# Patient Record
Sex: Male | Born: 2000
Health system: Southern US, Community
[De-identification: ages and names within clinical notes are randomized; demographics above are authoritative.]

## PROBLEM LIST (undated history)

## (undated) ENCOUNTER — Emergency Department (HOSPITAL_BASED_OUTPATIENT_CLINIC_OR_DEPARTMENT_OTHER): Admission: EM | Payer: 59 | Source: Home / Self Care

## (undated) DIAGNOSIS — H53009 Unspecified amblyopia, unspecified eye: Secondary | ICD-10-CM

## (undated) DIAGNOSIS — J45909 Unspecified asthma, uncomplicated: Secondary | ICD-10-CM

---
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
## 2020-04-09 ENCOUNTER — Inpatient Hospital Stay (HOSPITAL_COMMUNITY)
Admission: EM | Admit: 2020-04-09 | Discharge: 2020-04-18 | DRG: 558 | Disposition: A | Discharge status: Home / Self Care | Payer: No Typology Code available for payment source | Attending: Internal Medicine | Admitting: Internal Medicine

## 2020-04-09 ENCOUNTER — Other Ambulatory Visit: Payer: Self-pay

## 2020-04-09 ENCOUNTER — Encounter (HOSPITAL_COMMUNITY): Payer: Self-pay | Admitting: Emergency Medicine

## 2020-04-09 DIAGNOSIS — Z20822 Contact with and (suspected) exposure to covid-19: Secondary | ICD-10-CM | POA: Diagnosis present

## 2020-04-09 DIAGNOSIS — J45990 Exercise induced bronchospasm: Secondary | ICD-10-CM | POA: Diagnosis present

## 2020-04-09 DIAGNOSIS — J3089 Other allergic rhinitis: Secondary | ICD-10-CM

## 2020-04-09 DIAGNOSIS — M6282 Rhabdomyolysis: Principal | ICD-10-CM | POA: Diagnosis present

## 2020-04-09 DIAGNOSIS — Z8249 Family history of ischemic heart disease and other diseases of the circulatory system: Secondary | ICD-10-CM

## 2020-04-09 DIAGNOSIS — Z91018 Allergy to other foods: Secondary | ICD-10-CM

## 2020-04-09 DIAGNOSIS — Z03818 Encounter for observation for suspected exposure to other biological agents ruled out: Secondary | ICD-10-CM | POA: Diagnosis not present

## 2020-04-09 DIAGNOSIS — I7300 Raynaud's syndrome without gangrene: Secondary | ICD-10-CM | POA: Diagnosis present

## 2020-04-09 HISTORY — DX: Unspecified amblyopia, unspecified eye: H53.009

## 2020-04-09 NOTE — ED Triage Notes (Signed)
Patient reports bilateral upper arm muscle pain after lifting heavy weights yesterday during summer camp.

## 2020-04-10 ENCOUNTER — Encounter (HOSPITAL_COMMUNITY): Payer: Self-pay | Admitting: Emergency Medicine

## 2020-04-10 DIAGNOSIS — M6282 Rhabdomyolysis: Secondary | ICD-10-CM | POA: Diagnosis not present

## 2020-04-10 DIAGNOSIS — Z91018 Allergy to other foods: Secondary | ICD-10-CM | POA: Diagnosis not present

## 2020-04-10 DIAGNOSIS — J45990 Exercise induced bronchospasm: Secondary | ICD-10-CM

## 2020-04-10 DIAGNOSIS — I7300 Raynaud's syndrome without gangrene: Secondary | ICD-10-CM

## 2020-04-10 DIAGNOSIS — R748 Abnormal levels of other serum enzymes: Secondary | ICD-10-CM | POA: Diagnosis not present

## 2020-04-10 DIAGNOSIS — Z20822 Contact with and (suspected) exposure to covid-19: Secondary | ICD-10-CM | POA: Diagnosis present

## 2020-04-10 DIAGNOSIS — R7401 Elevation of levels of liver transaminase levels: Secondary | ICD-10-CM | POA: Diagnosis not present

## 2020-04-10 DIAGNOSIS — Z8249 Family history of ischemic heart disease and other diseases of the circulatory system: Secondary | ICD-10-CM | POA: Diagnosis not present

## 2020-04-10 DIAGNOSIS — J3089 Other allergic rhinitis: Secondary | ICD-10-CM

## 2020-04-10 HISTORY — DX: Other allergic rhinitis: J30.89

## 2020-04-10 HISTORY — DX: Exercise induced bronchospasm: J45.990

## 2020-04-10 HISTORY — DX: Raynaud's syndrome without gangrene: I73.00

## 2020-04-10 LAB — I-STAT CHEM 8, ED
BUN: 17 mg/dL (ref 6–20)
Calcium, Ion: 1.21 mmol/L (ref 1.15–1.40)
Chloride: 100 mmol/L (ref 98–111)
Creatinine, Ser: 1.2 mg/dL (ref 0.61–1.24)
Glucose, Bld: 81 mg/dL (ref 70–99)
HCT: 44 % (ref 39.0–52.0)
Hemoglobin: 15 g/dL (ref 13.0–17.0)
Potassium: 3.3 mmol/L — ABNORMAL LOW (ref 3.5–5.1)
Sodium: 140 mmol/L (ref 135–145)
TCO2: 30 mmol/L (ref 22–32)

## 2020-04-10 LAB — RENAL FUNCTION PANEL
Albumin: 3.6 g/dL (ref 3.5–5.0)
Albumin: 3.2 g/dL — ABNORMAL LOW (ref 3.5–5.0)
Albumin: 3.5 g/dL (ref 3.5–5.0)
Anion gap: 11 (ref 5–15)
Anion gap: 10 (ref 5–15)
Anion gap: 7 (ref 5–15)
BUN: 16 mg/dL (ref 6–20)
BUN: 16 mg/dL (ref 6–20)
BUN: 13 mg/dL (ref 6–20)
CO2: 23 mmol/L (ref 22–32)
CO2: 23 mmol/L (ref 22–32)
CO2: 23 mmol/L (ref 22–32)
Calcium: 8.7 mg/dL — ABNORMAL LOW (ref 8.9–10.3)
Calcium: 8.6 mg/dL — ABNORMAL LOW (ref 8.9–10.3)
Calcium: 8.7 mg/dL — ABNORMAL LOW (ref 8.9–10.3)
Chloride: 104 mmol/L (ref 98–111)
Chloride: 105 mmol/L (ref 98–111)
Chloride: 108 mmol/L (ref 98–111)
Creatinine, Ser: 1 mg/dL (ref 0.61–1.24)
Creatinine, Ser: 1.06 mg/dL (ref 0.61–1.24)
Creatinine, Ser: 1.03 mg/dL (ref 0.61–1.24)
GFR calc Af Amer: >60 mL/min (ref >60)
GFR calc Af Amer: >60 mL/min (ref >60)
GFR calc Af Amer: >60 mL/min (ref >60)
GFR calc non Af Amer: >60 mL/min (ref >60)
GFR calc non Af Amer: >60 mL/min (ref >60)
GFR calc non Af Amer: >60 mL/min (ref >60)
Glucose, Bld: 129 mg/dL — ABNORMAL HIGH (ref 70–99)
Glucose, Bld: 88 mg/dL (ref 70–99)
Glucose, Bld: 85 mg/dL (ref 70–99)
Phosphorus: 3.6 mg/dL (ref 2.5–4.6)
Phosphorus: 3.9 mg/dL (ref 2.5–4.6)
Phosphorus: 3.6 mg/dL (ref 2.5–4.6)
Potassium: 3.4 mmol/L — ABNORMAL LOW (ref 3.5–5.1)
Potassium: 4.5 mmol/L (ref 3.5–5.1)
Potassium: 4.3 mmol/L (ref 3.5–5.1)
Sodium: 138 mmol/L (ref 135–145)
Sodium: 138 mmol/L (ref 135–145)
Sodium: 138 mmol/L (ref 135–145)

## 2020-04-10 LAB — HIV ANTIBODY (ROUTINE TESTING W REFLEX): HIV Screen 4th Generation wRfx: NONREACTIVE

## 2020-04-10 LAB — CBC WITH DIFFERENTIAL/PLATELET
Abs Immature Granulocytes: 0.02 10*3/uL (ref 0.00–0.07)
Basophils Absolute: 0 10*3/uL (ref 0.0–0.1)
Basophils Relative: 0 %
Eosinophils Absolute: 0.2 10*3/uL (ref 0.0–0.5)
Eosinophils Relative: 2 %
HCT: 40.6 % (ref 39.0–52.0)
Hemoglobin: 12.8 g/dL — ABNORMAL LOW (ref 13.0–17.0)
Immature Granulocytes: 0 %
Lymphocytes Relative: 30 %
Lymphs Abs: 2.1 10*3/uL (ref 0.7–4.0)
MCH: 26.8 pg (ref 26.0–34.0)
MCHC: 31.5 g/dL (ref 30.0–36.0)
MCV: 84.9 fL (ref 80.0–100.0)
Monocytes Absolute: 0.5 10*3/uL (ref 0.1–1.0)
Monocytes Relative: 8 %
Neutro Abs: 4.1 10*3/uL (ref 1.7–7.7)
Neutrophils Relative %: 60 %
Platelets: 197 10*3/uL (ref 150–400)
RBC: 4.78 MIL/uL (ref 4.22–5.81)
RDW: 12.3 % (ref 11.5–15.5)
WBC: 6.9 10*3/uL (ref 4.0–10.5)
nRBC: 0 % (ref 0.0–0.2)

## 2020-04-10 LAB — SARS CORONAVIRUS 2 BY RT PCR (HOSPITAL ORDER, PERFORMED IN ~~LOC~~ HOSPITAL LAB): SARS Coronavirus 2: NEGATIVE

## 2020-04-10 LAB — CK
Total CK: 45589 U/L — ABNORMAL HIGH (ref 49–397)
Total CK: 45544 U/L — ABNORMAL HIGH (ref 49–397)
Total CK: >50000 U/L — ABNORMAL HIGH (ref 49–397)

## 2020-04-10 MED ORDER — NAPROXEN 250 MG PO TABS
500.0000 mg | ORAL_TABLET | Freq: Once | ORAL | Status: AC
  Administered 2020-04-10: 500 mg via ORAL
  Filled 2020-04-10: qty 2

## 2020-04-10 MED ORDER — SODIUM CHLORIDE 0.9 % IV BOLUS
1000.0000 mL | Freq: Once | INTRAVENOUS | Status: AC
  Administered 2020-04-10: 1000 mL via INTRAVENOUS

## 2020-04-10 MED ORDER — LACTATED RINGERS IV BOLUS
1000.0000 mL | Freq: Once | INTRAVENOUS | Status: AC
  Administered 2020-04-10: 1000 mL via INTRAVENOUS

## 2020-04-10 MED ORDER — LACTATED RINGERS IV SOLN: INTRAVENOUS | Status: DC

## 2020-04-10 MED ORDER — METHOCARBAMOL 500 MG PO TABS
500.0000 mg | ORAL_TABLET | Freq: Once | ORAL | Status: AC
  Administered 2020-04-10: 500 mg via ORAL
  Filled 2020-04-10: qty 1

## 2020-04-10 MED ORDER — POLYETHYLENE GLYCOL 3350 17 G PO PACK
17.0000 g | PACK | Freq: Every day | ORAL | Status: DC
  Filled 2020-04-10 (×3): qty 1

## 2020-04-10 MED ORDER — OXYCODONE HCL 5 MG PO TABS
5.0000 mg | ORAL_TABLET | ORAL | Status: DC | PRN
  Administered 2020-04-10: 5 mg via ORAL
  Filled 2020-04-10: qty 1

## 2020-04-10 MED ORDER — ALBUTEROL SULFATE (2.5 MG/3ML) 0.083% IN NEBU: 3.0000 mL | INHALATION_SOLUTION | Freq: Every day | RESPIRATORY_TRACT | Status: DC | PRN

## 2020-04-10 MED ORDER — ONDANSETRON HCL 4 MG PO TABS: 4.0000 mg | ORAL_TABLET | Freq: Four times a day (QID) | ORAL | Status: DC | PRN

## 2020-04-10 MED ORDER — ACETAMINOPHEN 325 MG PO TABS: 650.0000 mg | ORAL_TABLET | Freq: Four times a day (QID) | ORAL | Status: DC | PRN

## 2020-04-10 MED ORDER — ENOXAPARIN SODIUM 40 MG/0.4ML ~~LOC~~ SOLN
40.0000 mg | Freq: Every day | SUBCUTANEOUS | Status: DC
  Administered 2020-04-10 – 2020-04-12 (×3): 40 mg via SUBCUTANEOUS
  Filled 2020-04-10 (×6): qty 0.4

## 2020-04-10 MED ORDER — LORATADINE 10 MG PO TABS
10.0000 mg | ORAL_TABLET | Freq: Every day | ORAL | Status: DC
  Administered 2020-04-10 – 2020-04-18 (×9): 10 mg via ORAL
  Filled 2020-04-10 (×9): qty 1

## 2020-04-10 MED ORDER — SODIUM CHLORIDE 0.9 % IV BOLUS (SEPSIS)
1000.0000 mL | Freq: Once | INTRAVENOUS | Status: AC
  Administered 2020-04-10: 1000 mL via INTRAVENOUS

## 2020-04-10 MED ORDER — ONDANSETRON HCL 4 MG/2ML IJ SOLN: 4.0000 mg | Freq: Four times a day (QID) | INTRAMUSCULAR | Status: DC | PRN

## 2020-04-10 MED ORDER — MORPHINE SULFATE (PF) 4 MG/ML IV SOLN: 4.0000 mg | INTRAVENOUS | Status: DC | PRN

## 2020-04-10 MED ORDER — ACETAMINOPHEN 650 MG RE SUPP: 650.0000 mg | Freq: Four times a day (QID) | RECTAL | Status: DC | PRN

## 2020-04-10 NOTE — ED Notes (Signed)
Pt stated to this RN that he feels like he can straighten his arms a little better now than before. He is still unable to completley straighten them fully.

## 2020-04-10 NOTE — H&P (Signed)
Date: 04/10/2020               Patient Name:  Gregory Stevens MRN: [id number]  DOB: Jul 18, 2001 Age / Sex: 19 y.o., male   PCP: Patsi Sears, MD         Medical Service: Internal Medicine Teaching Service         Attending Physician: Dr. Lucious Groves, DO    First Contact: Dr. Madilyn Fireman Pager: [phone]  Second Contact: Dr. Sherry Ruffing Pager: [phone]       After Hours (After 5p/  First Contact Pager: [phone]  weekends / holidays): Second Contact Pager: [phone]   Chief Complaint: Arm pain  History of Present Illness: Gregory Stevens is a 19 y.o male with exercise induced asthma who presented to the ED with 2-3 days of progressive bilateral arm pain. History was obtained via the patient and through chart review.   Patient currently plays college football. They had a biceps workout on 5/24. Shortly after he felt his arms lock-up at a 90 degree angle and began to experience pain. He came home, showered, and tried to sleep it off. Unfortunately he woke up the next morning with continued pain and minimal improvement in his ROM. He spoke with his mom who recommended he come to the ED for further evaluation. He otherwise has been feeling fine and denies fevers/chills, headaches, rhinorrhea, congestion, sore throat, SHOB, chest pain, palpitations, abdominal pain, N/V, diarrhea, constipation, change in urine output, dysuria, new rash. He denies recent illnesses. He denies a personal or family history of autoimmune disease and has never had oral/genital ulcers, arthralgias, rashes, etc. He is not on any prescription or OTC medications/supplements aside from his albuterol and claritin. He denies the use of illicit substances.   Meds:  Current Meds  Medication Sig  . albuterol (PROVENTIL HFA;VENTOLIN HFA) 108 (90 BASE) MCG/ACT inhaler Inhale 2 puffs into the lungs daily as needed for wheezing or shortness of breath.   . loratadine (CLARITIN) 10 MG tablet Take 10 mg by mouth daily.    Allergies: Allergies as of 04/09/2020 - Review Complete 04/09/2020  Allergen Reaction Noted  . Kiwi extract Swelling 12/30/2013   Past Medical History:  Diagnosis Date  . Asthma   . Environmental and seasonal allergies 04/10/2020  . Exercise-induced asthma 04/10/2020  . Raynaud's disease 04/10/2020   Family History: + HTN  Social History: Gregory Stevens is college. Studying sports medicine. Occasional consumes EtOH, uses tobacco products, and marijuana.   Review of Systems: A complete ROS was negative except as per HPI.   Physical Exam: Blood pressure 129/85, pulse (!) 55, temperature 97.9 F (36.6 C), temperature source Oral, resp. rate 14, height 5\' 5"  (1.651 m), weight 70 kg, SpO2 100 %.  General: Well nourished male in no acute distress HENT: Normocephalic, atraumatic, moist mucus membranes Pulm: Good air movement with no wheezing or crackles  CV: RRR, no murmurs, no rubs  Abdomen: Active bowel sounds, soft, non-distended, no tenderness to palpation  Extremities: Pulses palpable in all extremities, tenderness to palpation of the bilateral biceps  Skin: Warm and dry  Neuro: Alert and oriented x 3  Assessment & Plan by Problem: Principal Problem:   Rhabdomyolysis Active Problems:   Exercise-induced asthma   Environmental and seasonal allergies  Gregory Stevens is a 19 y.o male with exercise induced asthma who presented to the ED with 2-3 days of progressive bilateral arm pain. He was found to have an elevated CK at ~45K. Admission was requested  for continued evaluation and management of Rhabdomyolysis.   Non-Traumatic Rhabdomyolysis  - Denies the use of alcohol/drugs, prescription medications/OTC supplements, recent infectious symptoms, signs/symptoms of autoimmune disease, or previous history to suggest underlying metabolic disorder. Therefore, this is likely secondary to his weight lifting  - Start aggressive IVF. Give 1L bolus LR (has received 2L thus far) and start  maintenance at 250cc/hr  - Monitor potassium, calcium, phosphorus, and creatinine every 8 hours. If his renal function begins to decline will start Sodium Bicarb  - Monitor CK levels every 12 hours - Strict I&O's  - Pain control with PRN oxycodone and morphine  - Start miralax  - Zofran for nausea   Exercise-induced Asthma  - Continue Claritin and PRN albuterol   Diet: Regular  VTE ppx: Lovenox  CODE STATUS: Full Code  Dispo: Admit patient to Inpatient with expected length of stay greater than 2 midnights.  SignedLevora Dredge, MD 04/10/2020, 10:06 AM  Pager: [phone]

## 2020-04-10 NOTE — ED Provider Notes (Signed)
[facility] EMERGENCY DEPARTMENT Provider Note   CSN: [id number] Arrival date & time: 04/09/20  2343    History Chief Complaint  Patient presents with  . Muscle Strain    Gregory Stevens is a 19 y.o. male with patient with history significant for asthma who presents for evaluation of bilateral arm pain.  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  They were lifting "heavy weights" yesterday.  States since then he has had bilateral biceps pain.  States originally after lifting he was unable to fully extend his arms.  States this has improved however he feels like his muscles are extremely tight feels like he has to force his arms open.  He feels like his biceps are swollen.  No overlying erythema or warmth.  Denies any popping sensation.  No fever, chills, nausea, vomiting, chest pain, shortness of breath, neck pain, neck stiffness, redness or warmth.  No paresthesias.  Denies additional aggravating or relieving factors.   History obtained from patient and past medical records.  No interpreter is used.  HPI     Past Medical History:  Diagnosis Date  . Asthma     There are no problems to display for this patient.   History reviewed. No pertinent surgical history.     No family history on file.  Social History   Tobacco Use  . Smoking status: Never Smoker  . Smokeless tobacco: Never Used  Substance Use Topics  . Alcohol use: No  . Drug use: Never    Home Medications Prior to Admission medications   Medication Sig Start Date End Date Taking? Authorizing Provider  albuterol (PROVENTIL HFA;VENTOLIN HFA) 108 (90 BASE) MCG/ACT inhaler Inhale 2 puffs into the lungs daily as needed for wheezing or shortness of breath.     [provider]  ALBUTEROL IN Inhale into the lungs.    [provider]  cyclobenzaprine (FLEXERIL) 5 MG tablet Take 1 tablet (5 mg total) by mouth 3 (three) times daily as needed for muscle spasms. For 3 days  03/06/15   Ree Shay, MD  ibuprofen (ADVIL,MOTRIN) 200 MG tablet Take 200 mg by mouth daily as needed (pain).    [provider]  ibuprofen (ADVIL,MOTRIN) 400 MG tablet Take 1 tablet (400 mg total) by mouth every 6 (six) hours as needed for mild pain. 12/30/13   Marcellina Millin, MD  ibuprofen (ADVIL,MOTRIN) 600 MG tablet Take 1 tablet (600 mg total) by mouth every 6 (six) hours as needed. 03/30/15   Piepenbrink, Victorino Dike, PA-C    Allergies    Kiwi extract  Review of Systems   Review of Systems  Constitutional: Negative.   HENT: Negative.   Respiratory: Negative.   Cardiovascular: Negative.   Gastrointestinal: Negative.   Genitourinary: Negative.   Musculoskeletal:       BL biceps pain  Skin: Negative.   Neurological: Negative.   All other systems reviewed and are negative.   Physical Exam Updated Vital Signs BP 129/85 (BP Location: Right Arm)   Pulse (!) 55   Temp 97.9 F (36.6 C) (Oral)   Resp 14   Ht 5\' 5"  (1.651 m)   Wt 70 kg   SpO2 100%   BMI 25.68 kg/m   Physical Exam Vitals and nursing note reviewed.  Constitutional:      General: He is not in acute distress.    Appearance: He is well-developed. He is not ill-appearing, toxic-appearing or diaphoretic.  HENT:     Head: Normocephalic  and atraumatic.     Nose: Nose normal.     Mouth/Throat:     Mouth: Mucous membranes are moist.  Eyes:     Pupils: Pupils are equal, round, and reactive to light.  Cardiovascular:     Rate and Rhythm: Normal rate and regular rhythm.  Pulmonary:     Effort: Pulmonary effort is normal. No respiratory distress.     Breath sounds: Normal breath sounds.  Abdominal:     General: Bowel sounds are normal. There is no distension.     Palpations: Abdomen is soft.     Tenderness: There is no abdominal tenderness. There is no right CVA tenderness, left CVA tenderness, guarding or rebound.  Musculoskeletal:        General: Normal range of motion.     Right shoulder: Normal.      Left shoulder: Normal.     Right upper arm: Tenderness present.     Left upper arm: Tenderness present.     Right elbow: Normal.     Left elbow: Normal.     Right forearm: Normal.     Left forearm: Normal.       Arms:     Cervical back: Normal range of motion and neck supple.     Comments: Moves all 4 extremities without difficulty. Diffuse tenderness to bilateral biceps. Able to flex and extend at BLE. Compartments soft.  Skin:    General: Skin is warm and dry.     Capillary Refill: Capillary refill takes less than 2 seconds.     Comments: No edema, erythema or warmth.  No fluctuance induration.  Neurological:     General: No focal deficit present.     Mental Status: He is alert.    ED Results / Procedures / Treatments   Labs (all labs ordered are listed, but only abnormal results are displayed) Labs Reviewed  I-STAT CHEM 8, ED - Abnormal; Notable for the following components:      Result Value   Potassium 3.3 (*)    All other components within normal limits  CK  CBC WITH DIFFERENTIAL/PLATELET    EKG None  Radiology No results found.  Procedures Procedures (including critical care time)  Medications Ordered in ED Medications  sodium chloride 0.9 % bolus 1,000 mL (has no administration in time range)  naproxen (NAPROSYN) tablet 500 mg (500 mg Oral Given 04/10/20 0414)  methocarbamol (ROBAXIN) tablet 500 mg (500 mg Oral Given 04/10/20 0415)    ED Course  I have reviewed the triage vital signs and the nursing notes.  Pertinent labs & imaging results that were available during my care of the patient were reviewed by me and considered in my medical decision making (see chart for details).  19 year old presents for evaluation of bilateral biceps pain.  Had heavy workout yesterday where he was lifting heavy weights.  He feels like he has some swelling to his bilateral biceps however no appreciable swelling on exam.  He is neurovascularly intact.  He has no overlying skin  changes.  He does have diffuse tenderness to his bilateral biceps.  No emesis.  Heart and lungs clear.  Abdomen soft.  Initially held his bilateral elbows and a semiflex position however is able to fully extend with passive range of motion.  No obvious muscle deformity.  No erythema or warmth.  I have low suspicion for VTE.  Concern for rhabdomyolysis.  We will plan on labs.  I-STAT Chem-8 with mild hypokalemia to 3.3  however no electrolyte, renal abnormality CK pending CBC pending  Unfortunately CK has been in process for greater than 2 hours.  We have consulted with lab.  They state that they had to dilute this and rerun it.  Given this will start 1 L of fluids.  Care transferred to [facility], PA-C who will follow up on additional labs and determine disposition.  If CK within normal limits patient may be DC home with symptomatic management. If Rhabdomyolysis may need admission    MDM Rules/Calculators/A&P                       Final Clinical Impression(s) / ED Diagnoses Final diagnoses:  None    Rx / DC Orders ED Discharge Orders    None       Azarria Balint A, PA-C 04/10/20 3875    Ripley Fraise, MD 04/10/20 0740

## 2020-04-10 NOTE — Progress Notes (Signed)
Pt's right arm considerable larger than left arm and presently hurting as patient has orders for bolus of fluid. MD suggested that fluids to run in L. Arm. IV team paged for placement.

## 2020-04-10 NOTE — ED Notes (Signed)
Breakfast tray ordered for pt

## 2020-04-10 NOTE — ED Notes (Signed)
RN called lab x2 about CK being in process for 2h.

## 2020-04-10 NOTE — Progress Notes (Signed)
Paged by nurse about patient reporting worsening right arm swelling and arm pain.  Went to evaluate at bedside.  Patient currently here with rhabdomyolysis being treated with IV fluids.  He reports that his right biceps area seems to be more swollen and tense then before, reports that the pain is pretty much the same however maybe a little worse.  He denied any numbness, tingling, weakness change in temperature, or change in ability to move his arm.  Pain is somewhat controlled by the pain medications.  On exam his right bicep area appears mildly more swollen than the left, tender to palpation, brachial and radial artery 2+ and equal, warm extremities, sensation intact and equal.   Patient is currently receiving LR 200 cc/h.  Pain appears to be due to edema and swelling from fluid resuscitation. Given his findings on exam and no neurological deficits noted I am not concerned for compartment syndrome at this time.  Advised him to contact us if pain worsens or he develops other symptoms.   -Will continue fluid resuscitation  -Continue pain management with Oxy and morphine -Monitor for worsening pain or new neurological or vascular deficits

## 2020-04-10 NOTE — ED Provider Notes (Signed)
7:24 AM BP 129/85 (BP Location: Right Arm)   Pulse (!) 55   Temp 97.9 F (36.6 C) (Oral)   Resp 14   Ht 5\' 5"  (1.651 m)   Wt 70 kg   SpO2 100%   BMI 25.68 kg/m  Patient taken in sign out at shift hand off.  Patient with  No sig pmh presents with BL arm pain after vigorous workout. Awaiting Labs. Suspect Rhabdo as Lab is having to run dilutions on the CK.  8:17am I spoke with the patient's mother [place]. She had a few questions and wanted to update his medical hx.   8:41 AM.Patient with marked elevation in his CK. He has received 1000 ml bolus NS and I have ordered a second. Creatinine wnl Will admit and update his mother.  Patient admitted to the Dr. of the internal medicine teaching service.  .Critical Care Performed by: Tamsen Roers, PA-C Authorized by: Arthor Captain, PA-C   Critical care provider statement:    Critical care time (minutes):  30   Critical care time was exclusive of:  Separately billable procedures and treating other patients   Critical care was necessary to treat or prevent imminent or life-threatening deterioration of the following conditions: rhabdomyolysis.   Critical care was time spent personally by me on the following activities:  Discussions with consultants, evaluation of patient's response to treatment, examination of patient, ordering and performing treatments and interventions, ordering and review of laboratory studies, ordering and review of radiographic studies, pulse oximetry, re-evaluation of patient's condition, obtaining history from patient or surrogate and review of old charts       Arthor Captain, PA-C 04/10/20 1009    04/12/20, MD 04/10/20 1055

## 2020-04-10 NOTE — ED Notes (Signed)
[phone] pt moms wants to be called when PA comes back. She has a minor child waiting at nursing station.

## 2020-04-10 NOTE — ED Notes (Signed)
Lunch Tray Ordered @ 1035. 

## 2020-04-10 NOTE — ED Notes (Signed)
Tech transporting pt upstairs at this time.

## 2020-04-11 DIAGNOSIS — J45990 Exercise induced bronchospasm: Secondary | ICD-10-CM

## 2020-04-11 DIAGNOSIS — R7401 Elevation of levels of liver transaminase levels: Secondary | ICD-10-CM

## 2020-04-11 DIAGNOSIS — R748 Abnormal levels of other serum enzymes: Secondary | ICD-10-CM

## 2020-04-11 LAB — RENAL FUNCTION PANEL
Albumin: 3.2 g/dL — ABNORMAL LOW (ref 3.5–5.0)
Albumin: 3 g/dL — ABNORMAL LOW (ref 3.5–5.0)
Albumin: 3.4 g/dL — ABNORMAL LOW (ref 3.5–5.0)
Albumin: 3.7 g/dL (ref 3.5–5.0)
Anion gap: 7 (ref 5–15)
Anion gap: 9 (ref 5–15)
Anion gap: 11 (ref 5–15)
Anion gap: 10 (ref 5–15)
BUN: 10 mg/dL (ref 6–20)
BUN: 7 mg/dL (ref 6–20)
BUN: 10 mg/dL (ref 6–20)
BUN: 9 mg/dL (ref 6–20)
CO2: 25 mmol/L (ref 22–32)
CO2: 26 mmol/L (ref 22–32)
CO2: 25 mmol/L (ref 22–32)
CO2: 27 mmol/L (ref 22–32)
Calcium: 8.8 mg/dL — ABNORMAL LOW (ref 8.9–10.3)
Calcium: 9 mg/dL (ref 8.9–10.3)
Calcium: 9.2 mg/dL (ref 8.9–10.3)
Calcium: 9.4 mg/dL (ref 8.9–10.3)
Chloride: 107 mmol/L (ref 98–111)
Chloride: 105 mmol/L (ref 98–111)
Chloride: 105 mmol/L (ref 98–111)
Chloride: 104 mmol/L (ref 98–111)
Creatinine, Ser: 0.92 mg/dL (ref 0.61–1.24)
Creatinine, Ser: 1 mg/dL (ref 0.61–1.24)
Creatinine, Ser: 0.97 mg/dL (ref 0.61–1.24)
Creatinine, Ser: 1 mg/dL (ref 0.61–1.24)
GFR calc Af Amer: >60 mL/min (ref >60)
GFR calc Af Amer: >60 mL/min (ref >60)
GFR calc Af Amer: >60 mL/min (ref >60)
GFR calc Af Amer: >60 mL/min (ref >60)
GFR calc non Af Amer: >60 mL/min (ref >60)
GFR calc non Af Amer: >60 mL/min (ref >60)
GFR calc non Af Amer: >60 mL/min (ref >60)
GFR calc non Af Amer: >60 mL/min (ref >60)
Glucose, Bld: 91 mg/dL (ref 70–99)
Glucose, Bld: 111 mg/dL — ABNORMAL HIGH (ref 70–99)
Glucose, Bld: 99 mg/dL (ref 70–99)
Glucose, Bld: 81 mg/dL (ref 70–99)
Phosphorus: 4.2 mg/dL (ref 2.5–4.6)
Phosphorus: 3.7 mg/dL (ref 2.5–4.6)
Phosphorus: 4.3 mg/dL (ref 2.5–4.6)
Phosphorus: 4.6 mg/dL (ref 2.5–4.6)
Potassium: 3.8 mmol/L (ref 3.5–5.1)
Potassium: 3.7 mmol/L (ref 3.5–5.1)
Potassium: 4 mmol/L (ref 3.5–5.1)
Potassium: 3.8 mmol/L (ref 3.5–5.1)
Sodium: 139 mmol/L (ref 135–145)
Sodium: 140 mmol/L (ref 135–145)
Sodium: 141 mmol/L (ref 135–145)
Sodium: 141 mmol/L (ref 135–145)

## 2020-04-11 LAB — CK
Total CK: >50000 U/L — ABNORMAL HIGH (ref 49–397)
Total CK: 48114 U/L — ABNORMAL HIGH (ref 49–397)
Total CK: >50000 U/L — ABNORMAL HIGH (ref 49–397)

## 2020-04-11 LAB — URINALYSIS, ROUTINE W REFLEX MICROSCOPIC
Bacteria, UA: NONE SEEN
Bilirubin Urine: NEGATIVE
Glucose, UA: NEGATIVE mg/dL
Ketones, ur: NEGATIVE mg/dL
Leukocytes,Ua: NEGATIVE
Nitrite: NEGATIVE
Protein, ur: NEGATIVE mg/dL
Specific Gravity, Urine: 1.008 (ref 1.005–1.030)
pH: 9 — ABNORMAL HIGH (ref 5.0–8.0)

## 2020-04-11 NOTE — Progress Notes (Signed)
Subjective: Patient feeling well this AM. He continues to have some pain and swelling in his arms, R>L, which is manageable. We talked with him about the plan and the importance of monitoring his urine output.   Consults: na  Objective:  Vital signs in last 24 hours: Vitals:   04/10/20 1255 04/10/20 1820 04/11/20 0046 04/11/20 0450  BP: 132/89 131/72 133/84 (!) 134/95  Pulse: (!) 57 (!) 58 (!) 56 (!) 52  Resp: 18 18 17 18   Temp: 98.4 F (36.9 C) 98.1 F (36.7 C) 97.7 F (36.5 C) 97.7 F (36.5 C)  TempSrc: Oral Oral Oral Oral  SpO2: 100% 100% 100% 100%  Weight:      Height:       Physical Exam  Constitutional: He is well-developed, well-nourished, and in no distress. No distress.  HENT:  Head: Normocephalic and atraumatic.  Cardiovascular: Normal rate, regular rhythm and intact distal pulses.  No murmur heard. Pulmonary/Chest: Effort normal and breath sounds normal. No respiratory distress.  Musculoskeletal:     Comments: Edematous bilateral upper extremities, R>L, skin taught on the R  Neurological: He is alert.  Normal sensation in upper extremities  Skin: Skin is warm and dry. He is not diaphoretic.  Psychiatric: Affect normal.  Nursing note and vitals reviewed.  I/Os:  Intake/Output Summary (Last 24 hours) at 04/11/2020 1103 Last data filed at 04/11/2020 1056 Gross per 24 hour  Intake 4460.31 ml  Output 750 ml  Net 3710.31 ml   Telemetry:  Labs: Results for orders placed or performed during the hospital encounter of 04/09/20 (from the past 24 hour(s))  Renal function panel     Status: Abnormal   Collection Time: 04/10/20  5:33 PM  Result Value Ref Range   Sodium 138 135 - 145 mmol/L   Potassium 4.5 3.5 - 5.1 mmol/L   Chloride 105 98 - 111 mmol/L   CO2 23 22 - 32 mmol/L   Glucose, Bld 88 70 - 99 mg/dL   BUN 16 6 - 20 mg/dL   Creatinine, Ser 04/12/20 0.61 - 1.24 mg/dL   Calcium 8.6 (L) 8.9 - 10.3 mg/dL   Phosphorus 3.9 2.5 - 4.6 mg/dL   Albumin 3.2 (L) 3.5  - 5.0 g/dL   GFR calc non Af Amer >60 >60 mL/min   GFR calc Af Amer >60 >60 mL/min   Anion gap 10 5 - 15  CK     Status: Abnormal   Collection Time: 04/10/20  5:33 PM  Result Value Ref Range   Total CK >50,000 (H) 49 - 397 U/L  Renal function panel     Status: Abnormal   Collection Time: 04/10/20 10:21 PM  Result Value Ref Range   Sodium 138 135 - 145 mmol/L   Potassium 4.3 3.5 - 5.1 mmol/L   Chloride 108 98 - 111 mmol/L   CO2 23 22 - 32 mmol/L   Glucose, Bld 85 70 - 99 mg/dL   BUN 13 6 - 20 mg/dL   Creatinine, Ser 04/12/20 0.61 - 1.24 mg/dL   Calcium 8.7 (L) 8.9 - 10.3 mg/dL   Phosphorus 3.6 2.5 - 4.6 mg/dL   Albumin 3.5 3.5 - 5.0 g/dL   GFR calc non Af Amer >60 >60 mL/min   GFR calc Af Amer >60 >60 mL/min   Anion gap 7 5 - 15  CK     Status: Abnormal   Collection Time: 04/11/20  1:53 AM  Result Value Ref Range   Total CK >  50,000 (H) 49 - 397 U/L  Renal function panel     Status: Abnormal   Collection Time: 04/11/20  1:53 AM  Result Value Ref Range   Sodium 139 135 - 145 mmol/L   Potassium 3.8 3.5 - 5.1 mmol/L   Chloride 107 98 - 111 mmol/L   CO2 25 22 - 32 mmol/L   Glucose, Bld 91 70 - 99 mg/dL   BUN 10 6 - 20 mg/dL   Creatinine, Ser 0.92 0.61 - 1.24 mg/dL   Calcium 8.8 (L) 8.9 - 10.3 mg/dL   Phosphorus 4.2 2.5 - 4.6 mg/dL   Albumin 3.2 (L) 3.5 - 5.0 g/dL   GFR calc non Af Amer >60 >60 mL/min   GFR calc Af Amer >60 >60 mL/min   Anion gap 7 5 - 15  Renal function panel     Status: Abnormal   Collection Time: 04/11/20  8:20 AM  Result Value Ref Range   Sodium 140 135 - 145 mmol/L   Potassium 3.7 3.5 - 5.1 mmol/L   Chloride 105 98 - 111 mmol/L   CO2 26 22 - 32 mmol/L   Glucose, Bld 111 (H) 70 - 99 mg/dL   BUN 7 6 - 20 mg/dL   Creatinine, Ser 1.00 0.61 - 1.24 mg/dL   Calcium 9.0 8.9 - 10.3 mg/dL   Phosphorus 3.7 2.5 - 4.6 mg/dL   Albumin 3.0 (L) 3.5 - 5.0 g/dL   GFR calc non Af Amer >60 >60 mL/min   GFR calc Af Amer >60 >60 mL/min   Anion gap 9 5 - 15  CK      Status: Abnormal   Collection Time: 04/11/20  8:20 AM  Result Value Ref Range   Total CK 48,114 (H) 49 - 397 U/L   Imaging:  No results found.   Assessment/Plan:  Assessment: Mr. Middlesworth is an 19yo M w/ exercise induced asthma who presented to the ED with 2-3 days of progressive bilateral arm pain found to have an elevated CK of around 45K here for continued treatment and management of rhabdomyolysis.   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 bilateral upper extremity pain found to have rhabdomyolysis with CK of 45K -he reports the pain started after a "bicep burn" workup; he reports he tries to drink water regularly but think he does not stay adequately hydrated; he also report significant sweating  -has received 4500 ml since admission -currently getting 468ml/hr of LR -unfortunately, output has not been recorded until we saw him this morning at which time he urinated 400 ml of light colored urine -creatinine remains stable and normal -CK elevated today to >50K overnight, down to 48K this AM -unclear if he requires more fluids as we haven't been keeping track of his output -no indication to consult nephrology at this time given stable creatinine and this is only day 1 -mom somewhat concerned that this could have been a side effect of his COVID vaccine; he is s/p one dose, 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

## 2020-04-12 LAB — COMPREHENSIVE METABOLIC PANEL
ALT: 184 U/L — ABNORMAL HIGH (ref 0–44)
AST: 522 U/L — ABNORMAL HIGH (ref 15–41)
Albumin: 3.3 g/dL — ABNORMAL LOW (ref 3.5–5.0)
Alkaline Phosphatase: 40 U/L (ref 38–126)
Anion gap: 10 (ref 5–15)
BUN: 7 mg/dL (ref 6–20)
CO2: 30 mmol/L (ref 22–32)
Calcium: 9.4 mg/dL (ref 8.9–10.3)
Chloride: 101 mmol/L (ref 98–111)
Creatinine, Ser: 0.9 mg/dL (ref 0.61–1.24)
GFR calc Af Amer: >60 mL/min (ref >60)
GFR calc non Af Amer: >60 mL/min (ref >60)
Glucose, Bld: 92 mg/dL (ref 70–99)
Potassium: 4 mmol/L (ref 3.5–5.1)
Sodium: 141 mmol/L (ref 135–145)
Total Bilirubin: 0.3 mg/dL (ref 0.3–1.2)
Total Protein: 5.8 g/dL — ABNORMAL LOW (ref 6.5–8.1)

## 2020-04-12 LAB — CK: Total CK: >50000 U/L — ABNORMAL HIGH (ref 49–397)

## 2020-04-12 NOTE — Progress Notes (Signed)
Subjective: Patient reports that he is still having some pain. He reports that he still having some swelling in his arm. Discussed importance of monitoring his urine output.   Consults: na  Objective:  Vital signs in last 24 hours: Vitals:   04/11/20 1258 04/11/20 1747 04/12/20 0037 04/12/20 0614  BP: 130/89 129/80 131/82 (!) 140/92  Pulse: 60 (!) 57 (!) 53 (!) 45  Resp: 18 18 18 18   Temp: 98.2 F (36.8 C) 99.2 F (37.3 C) 98.1 F (36.7 C) 97.9 F (36.6 C)  TempSrc: Oral Oral Oral Oral  SpO2: 100% 100% 100% 100%  Weight:    70.2 kg  Height:       Physical Exam  Constitutional: He is well-developed, well-nourished, and in no distress. No distress.  HENT:  Head: Normocephalic and atraumatic.  Cardiovascular: Normal rate, regular rhythm and intact distal pulses.  No murmur heard. Pulmonary/Chest: Effort normal and breath sounds normal. No respiratory distress.  Musculoskeletal:        General: Normal range of motion.     Comments: Mildly edematous bilateral upper extremities, R>L, skin taught on the R  Neurological: He is alert.  Normal sensation in upper extremities  Skin: Skin is warm and dry. He is not diaphoretic.  Psychiatric: Affect normal.  Nursing note and vitals reviewed.  I/Os:  Intake/Output Summary (Last 24 hours) at 04/12/2020 0835 Last data filed at 04/12/2020 9628 Gross per 24 hour  Intake 10690.3 ml  Output 3700 ml  Net 6990.3 ml   Telemetry:  Labs: Results for orders placed or performed during the hospital encounter of 04/09/20 (from the past 24 hour(s))  Renal function panel     Status: Abnormal   Collection Time: 04/11/20  2:13 PM  Result Value Ref Range   Sodium 141 135 - 145 mmol/L   Potassium 4.0 3.5 - 5.1 mmol/L   Chloride 105 98 - 111 mmol/L   CO2 25 22 - 32 mmol/L   Glucose, Bld 99 70 - 99 mg/dL   BUN 10 6 - 20 mg/dL   Creatinine, Ser 0.97 0.61 - 1.24 mg/dL   Calcium 9.2 8.9 - 10.3 mg/dL   Phosphorus 4.3 2.5 - 4.6 mg/dL   Albumin  3.4 (L) 3.5 - 5.0 g/dL   GFR calc non Af Amer >60 >60 mL/min   GFR calc Af Amer >60 >60 mL/min   Anion gap 11 5 - 15  CK     Status: Abnormal   Collection Time: 04/11/20  5:41 PM  Result Value Ref Range   Total CK >50,000 (H) 49 - 397 U/L  Renal function panel     Status: None   Collection Time: 04/11/20  5:41 PM  Result Value Ref Range   Sodium 141 135 - 145 mmol/L   Potassium 3.8 3.5 - 5.1 mmol/L   Chloride 104 98 - 111 mmol/L   CO2 27 22 - 32 mmol/L   Glucose, Bld 81 70 - 99 mg/dL   BUN 9 6 - 20 mg/dL   Creatinine, Ser 1.00 0.61 - 1.24 mg/dL   Calcium 9.4 8.9 - 10.3 mg/dL   Phosphorus 4.6 2.5 - 4.6 mg/dL   Albumin 3.7 3.5 - 5.0 g/dL   GFR calc non Af Amer >60 >60 mL/min   GFR calc Af Amer >60 >60 mL/min   Anion gap 10 5 - 15  Urinalysis, Routine w reflex microscopic     Status: Abnormal   Collection Time: 04/11/20  6:30 PM  Result Value Ref Range   Color, Urine COLORLESS (A) YELLOW   APPearance CLEAR CLEAR   Specific Gravity, Urine 1.008 1.005 - 1.030   pH 9.0 (H) 5.0 - 8.0   Glucose, UA NEGATIVE NEGATIVE mg/dL   Hgb urine dipstick MODERATE (A) NEGATIVE   Bilirubin Urine NEGATIVE NEGATIVE   Ketones, ur NEGATIVE NEGATIVE mg/dL   Protein, ur NEGATIVE NEGATIVE mg/dL   Nitrite NEGATIVE NEGATIVE   Leukocytes,Ua NEGATIVE NEGATIVE   RBC / HPF 0-5 0 - 5 RBC/hpf   WBC, UA 0-5 0 - 5 WBC/hpf   Bacteria, UA NONE SEEN NONE SEEN  Comprehensive metabolic panel     Status: Abnormal   Collection Time: 04/12/20  6:00 AM  Result Value Ref Range   Sodium 141 135 - 145 mmol/L   Potassium 4.0 3.5 - 5.1 mmol/L   Chloride 101 98 - 111 mmol/L   CO2 30 22 - 32 mmol/L   Glucose, Bld 92 70 - 99 mg/dL   BUN 7 6 - 20 mg/dL   Creatinine, Ser 9.37 0.61 - 1.24 mg/dL   Calcium 9.4 8.9 - 16.9 mg/dL   Total Protein 5.8 (L) 6.5 - 8.1 g/dL   Albumin 3.3 (L) 3.5 - 5.0 g/dL   AST 678 (H) 15 - 41 U/L   ALT 184 (H) 0 - 44 U/L   Alkaline Phosphatase 40 38 - 126 U/L   Total Bilirubin 0.3 0.3 - 1.2  mg/dL   GFR calc non Af Amer >60 >60 mL/min   GFR calc Af Amer >60 >60 mL/min   Anion gap 10 5 - 15  CK     Status: Abnormal   Collection Time: 04/12/20  6:00 AM  Result Value Ref Range   Total CK >50,000 (H) 49 - 397 U/L   Imaging:  No results found.   Assessment/Plan:  Assessment: Mr. Errico is an 19yo M w/ exercise induced asthma who presented to the ED with 2-3 days of progressive bilateral arm pain found to have an elevated CK of around 45K here for continued treatment and management of rhabdomyolysis.   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 bilateral upper extremity pain found to have rhabdomyolysis with CK of 45K -he reports the pain started after a "bicep burn" workup; he reports he tries to drink water regularly but think he does not stay adequately hydrated; he also report significant sweating  -currently getting 468ml/hr of LR -urinating at least 200 ml/hr -creatinine remains stable and normal -CK about the same today at >50K -elevated AST/ALT, 522 and 184, likely sequela of muscle breakdown -no indication to consult nephrology at this time given stable creatinine and this is only day 1 -mom somewhat concerned that this could have been a side effect of his COVID vaccine; he is s/p one dose of pfizer which he received on 04/01/2020; there have been some case studies; more likely this was due to exercise  Plan: -continue maintenance fluid -strict I/Os -daily CMP/CK -continue oxy/morphine for pain   Active Problems:   Exercise-induced asthma   Environmental and seasonal allergies -albuterol PRN   Dispo: Anticipated discharge pending clinical course  Jenell Milliner, MD 04/12/2020, 8:35 AM Pager: [phone]

## 2020-04-13 DIAGNOSIS — M6282 Rhabdomyolysis: Principal | ICD-10-CM

## 2020-04-13 LAB — COMPREHENSIVE METABOLIC PANEL
ALT: 230 U/L — ABNORMAL HIGH (ref 0–44)
AST: 553 U/L — ABNORMAL HIGH (ref 15–41)
Albumin: 3.4 g/dL — ABNORMAL LOW (ref 3.5–5.0)
Alkaline Phosphatase: 41 U/L (ref 38–126)
Anion gap: 11 (ref 5–15)
BUN: 6 mg/dL (ref 6–20)
CO2: 29 mmol/L (ref 22–32)
Calcium: 9.5 mg/dL (ref 8.9–10.3)
Chloride: 103 mmol/L (ref 98–111)
Creatinine, Ser: 1.03 mg/dL (ref 0.61–1.24)
GFR calc Af Amer: >60 mL/min (ref >60)
GFR calc non Af Amer: >60 mL/min (ref >60)
Glucose, Bld: 81 mg/dL (ref 70–99)
Potassium: 3.8 mmol/L (ref 3.5–5.1)
Sodium: 143 mmol/L (ref 135–145)
Total Bilirubin: 0.5 mg/dL (ref 0.3–1.2)
Total Protein: 6.2 g/dL — ABNORMAL LOW (ref 6.5–8.1)

## 2020-04-13 LAB — CK: Total CK: >50000 U/L — ABNORMAL HIGH (ref 49–397)

## 2020-04-13 MED ORDER — SODIUM CHLORIDE 0.9% FLUSH: 10.0000 mL | INTRAVENOUS | Status: DC | PRN

## 2020-04-13 MED ORDER — SODIUM CHLORIDE 0.9% FLUSH
10.0000 mL | Freq: Two times a day (BID) | INTRAVENOUS | Status: DC
  Administered 2020-04-13 – 2020-04-17 (×7): 10 mL

## 2020-04-13 NOTE — Progress Notes (Addendum)
    Subjective: Patient reports doing a little better today, feels like the pain has improved. He denies any numbness, tingling, weakness, or changes in temperature. He denies any new issues.    Consults: na  Objective:  Vital signs in last 24 hours: Vitals:   04/12/20 2021 04/12/20 2309 04/13/20 0451 04/13/20 0500  BP: (!) 151/88 (!) 157/92 (!) 159/87   Pulse: (!) 52 (!) 52 (!) 48   Resp: 18 18 17    Temp: 99.4 F (37.4 C) 98.1 F (36.7 C) 97.9 F (36.6 C)   TempSrc: Oral Oral Oral   SpO2: 100% 100% 100%   Weight:    70.3 kg  Height:       Physical Exam  Constitutional: He is well-developed, well-nourished, and in no distress. No distress.  HENT:  Head: Normocephalic and atraumatic.  Cardiovascular: Normal rate, regular rhythm and intact distal pulses.  No murmur heard. Pulmonary/Chest: Effort normal and breath sounds normal. No respiratory distress.  Musculoskeletal:        General: Normal range of motion.     Comments: Edematous bilateral upper extremities, R>L, skin taught on the R, TTP  Neurological: He is alert.  Normal sensation in upper extremities  Skin: Skin is warm and dry. He is not diaphoretic.  Psychiatric: Affect normal.  Nursing note and vitals reviewed.  I/Os:  Intake/Output Summary (Last 24 hours) at 04/13/2020 0648 Last data filed at 04/13/2020 0522 Gross per 24 hour  Intake 4583.89 ml  Output 6465 ml  Net -1881.11 ml   Telemetry:  Labs: No results found for this or any previous visit (from the past 24 hour(s)). Imaging:  No results found.   Assessment/Plan:  Assessment: [name] is an 19yo M w/ exercise induced asthma who presented to the ED with 2-3 days of progressive bilateral arm pain found to have an elevated CK of around 45K here for continued treatment and management of rhabdomyolysis.   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 upper extremity pain  found to have rhabdomyolysis with CK of 45K -he reports the pain started after a "bicep burn" workup; he reports he tries to drink water regularly but think he does not stay adequately hydrated; he also report significant sweating  -currently getting 466ml/hr of LR -urinating at least 200 ml/hr, output of 6.4L yesterday -creatinine remains stable and normal -CK pending from today -Yesterday AST/ALT, 522 and 184, likely sequela of muscle breakdown, AST/ALT 553 and 230 -no indication to consult nephrology at this time -mom somewhat concerned that this could have been a side effect of his COVID vaccine; he is s/p one dose of pfizer which he received on 04/01/2020; there have been some case studies; more likely this was due to exercise  Plan: -continue maintenance fluid -strict I/Os, goal output of 200 cc/hr -daily CMP/CK -continue oxy/morphine for pain  Active Problems:   Exercise-induced asthma   Environmental and seasonal allergies -albuterol PRN  Dispo: Anticipated discharge pending clinical course  04/03/2020, MD 04/13/2020, 6:48 AM

## 2020-04-14 LAB — COMPREHENSIVE METABOLIC PANEL
ALT: 223 U/L — ABNORMAL HIGH (ref 0–44)
AST: 404 U/L — ABNORMAL HIGH (ref 15–41)
Albumin: 3.2 g/dL — ABNORMAL LOW (ref 3.5–5.0)
Alkaline Phosphatase: 41 U/L (ref 38–126)
Anion gap: 9 (ref 5–15)
BUN: 5 mg/dL — ABNORMAL LOW (ref 6–20)
CO2: 27 mmol/L (ref 22–32)
Calcium: 9.2 mg/dL (ref 8.9–10.3)
Chloride: 102 mmol/L (ref 98–111)
Creatinine, Ser: 0.88 mg/dL (ref 0.61–1.24)
GFR calc Af Amer: >60 mL/min (ref >60)
GFR calc non Af Amer: >60 mL/min (ref >60)
Glucose, Bld: 118 mg/dL — ABNORMAL HIGH (ref 70–99)
Potassium: 3.5 mmol/L (ref 3.5–5.1)
Sodium: 138 mmol/L (ref 135–145)
Total Bilirubin: 0.6 mg/dL (ref 0.3–1.2)
Total Protein: 5.7 g/dL — ABNORMAL LOW (ref 6.5–8.1)

## 2020-04-14 LAB — CK: Total CK: 39182 U/L — ABNORMAL HIGH (ref 49–397)

## 2020-04-14 NOTE — Progress Notes (Addendum)
Subjective: Patient reports he has no pain today. He denies any numbness, tingling, weakness, or changes in temperature. He has no complaints.   Consults: na  Objective:  Vital signs in last 24 hours: Vitals:   04/13/20 1207 04/13/20 1806 04/14/20 0025 04/14/20 0539  BP: (!) 163/94 (!) 155/87 (!) 147/91 121/78  Pulse: (!) 52 (!) 48 (!) 48 (!) 45  Resp: 18 18 18 18   Temp: 98.7 F (37.1 C) 98.1 F (36.7 C) 98.2 F (36.8 C) 98 F (36.7 C)  TempSrc: Oral Oral Oral Oral  SpO2: 100% 100% 100% 100%  Weight:    69.4 kg  Height:       Physical Exam  Constitutional: He is well-developed, well-nourished, and in no distress. No distress.  HENT:  Head: Normocephalic and atraumatic.  Cardiovascular: Normal rate, regular rhythm and intact distal pulses.  No murmur heard. Pulmonary/Chest: Effort normal and breath sounds normal. No respiratory distress.  Musculoskeletal:        General: Normal range of motion.     Comments: Edematous RUE, skin taught, non TTP  Neurological: He is alert.  Normal strength and sensation in upper extremities  Skin: Skin is warm and dry. He is not diaphoretic.  Psychiatric: Affect normal.  Nursing note and vitals reviewed.  I/Os:  Intake/Output Summary (Last 24 hours) at 04/14/2020 0854 Last data filed at 04/14/2020 0809 Gross per 24 hour  Intake 1642 ml  Output 10201 ml  Net -8559 ml   Telemetry:  Labs: Results for orders placed or performed during the hospital encounter of 04/09/20 (from the past 24 hour(s))  CK     Status: Abnormal   Collection Time: 04/13/20 12:53 PM  Result Value Ref Range   Total CK >50,000 (H) 49 - 397 U/L  Comprehensive metabolic panel     Status: Abnormal   Collection Time: 04/13/20 12:53 PM  Result Value Ref Range   Sodium 143 135 - 145 mmol/L   Potassium 3.8 3.5 - 5.1 mmol/L   Chloride 103 98 - 111 mmol/L   CO2 29 22 - 32 mmol/L   Glucose, Bld 81 70 - 99 mg/dL   BUN 6 6 - 20 mg/dL   Creatinine, Ser 1.03 0.61 -  1.24 mg/dL   Calcium 9.5 8.9 - 10.3 mg/dL   Total Protein 6.2 (L) 6.5 - 8.1 g/dL   Albumin 3.4 (L) 3.5 - 5.0 g/dL   AST 553 (H) 15 - 41 U/L   ALT 230 (H) 0 - 44 U/L   Alkaline Phosphatase 41 38 - 126 U/L   Total Bilirubin 0.5 0.3 - 1.2 mg/dL   GFR calc non Af Amer >60 >60 mL/min   GFR calc Af Amer >60 >60 mL/min   Anion gap 11 5 - 15  CK     Status: Abnormal   Collection Time: 04/14/20  6:51 AM  Result Value Ref Range   Total CK 39,182 (H) 49 - 397 U/L  Comprehensive metabolic panel     Status: Abnormal   Collection Time: 04/14/20  6:51 AM  Result Value Ref Range   Sodium 138 135 - 145 mmol/L   Potassium 3.5 3.5 - 5.1 mmol/L   Chloride 102 98 - 111 mmol/L   CO2 27 22 - 32 mmol/L   Glucose, Bld 118 (H) 70 - 99 mg/dL   BUN 5 (L) 6 - 20 mg/dL   Creatinine, Ser 0.88 0.61 - 1.24 mg/dL   Calcium 9.2 8.9 - 10.3 mg/dL  Total Protein 5.7 (L) 6.5 - 8.1 g/dL   Albumin 3.2 (L) 3.5 - 5.0 g/dL   AST 637 (H) 15 - 41 U/L   ALT 223 (H) 0 - 44 U/L   Alkaline Phosphatase 41 38 - 126 U/L   Total Bilirubin 0.6 0.3 - 1.2 mg/dL   GFR calc non Af Amer >60 >60 mL/min   GFR calc Af Amer >60 >60 mL/min   Anion gap 9 5 - 15   Imaging:  No results found.   Assessment/Plan:  Assessment: [name] is an 19yo M w/ exercise induced asthma who presented to the ED with 2-3 days of progressive bilateral arm pain found to have an elevated CK of around 45K here for continued treatment and management of rhabdomyolysis.   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 upper extremity pain found to have rhabdomyolysis with CK of 45K -he reports the pain started after a "bicep burn" workup; he reports he tries to drink water regularly but think he does not stay adequately hydrated; he also report significant sweating  -mom somewhat concerned that this could have been a side effect of his COVID vaccine; he is s/p one dose of pfizer which  he received on 04/01/2020; there have been some case studies; more likely this was due to exercise -currently getting 430ml/hr of LR -urinating at least 200 ml/hr, output of 7.4L today -creatinine pending from today; update, creatinine stable at 0.88 -CK pending from today; update, CK down from >50,000 to 39,182 -AST/ALT elevated, likely sequela of muscle breakdown,  522 and 184 to 553 and 230; CMP still pending from today; update, AST and ALT downtrending to 404 and 223, respectively -no indication to consult nephrology at this time -CK has been >50,000 the last few days; it could be that the muscle breakdown peaked at an unknown elevated number and it is in fact coming down but we can't see that yet as the number is still >50,000; no signs of compartment syndrome and patient denies any pain or prior sx which initially brought him in concerning for ongoing muscle breakdown at this time; update, significant downtrending of CK from >50,000 yesterday to 39,182 today  Plan: -continue maintenance fluid, can decrease to 445ml/hr -strict I/Os, goal output of 200 cc/hr -daily CMP/CK -continue oxy/morphine for pain  Active Problems:   Exercise-induced asthma   Environmental and seasonal allergies -albuterol PRN  Dispo: Anticipated discharge pending clinical course  Jenell Milliner, MD 04/14/2020, 8:54 AM

## 2020-04-15 LAB — COMPREHENSIVE METABOLIC PANEL
ALT: 250 U/L — ABNORMAL HIGH (ref 0–44)
ALT: 256 U/L — ABNORMAL HIGH (ref 0–44)
AST: 390 U/L — ABNORMAL HIGH (ref 15–41)
AST: 345 U/L — ABNORMAL HIGH (ref 15–41)
Albumin: 3.6 g/dL (ref 3.5–5.0)
Albumin: 3.9 g/dL (ref 3.5–5.0)
Alkaline Phosphatase: 44 U/L (ref 38–126)
Alkaline Phosphatase: 47 U/L (ref 38–126)
Anion gap: 15 (ref 5–15)
Anion gap: 10 (ref 5–15)
BUN: 9 mg/dL (ref 6–20)
BUN: 7 mg/dL (ref 6–20)
CO2: 25 mmol/L (ref 22–32)
CO2: 28 mmol/L (ref 22–32)
Calcium: 9.5 mg/dL (ref 8.9–10.3)
Calcium: 9.8 mg/dL (ref 8.9–10.3)
Chloride: 101 mmol/L (ref 98–111)
Chloride: 103 mmol/L (ref 98–111)
Creatinine, Ser: 0.91 mg/dL (ref 0.61–1.24)
Creatinine, Ser: 1.07 mg/dL (ref 0.61–1.24)
GFR calc Af Amer: >60 mL/min (ref >60)
GFR calc Af Amer: >60 mL/min (ref >60)
GFR calc non Af Amer: >60 mL/min (ref >60)
GFR calc non Af Amer: >60 mL/min (ref >60)
Glucose, Bld: 89 mg/dL (ref 70–99)
Glucose, Bld: 87 mg/dL (ref 70–99)
Potassium: 3.9 mmol/L (ref 3.5–5.1)
Potassium: 3.8 mmol/L (ref 3.5–5.1)
Sodium: 141 mmol/L (ref 135–145)
Sodium: 141 mmol/L (ref 135–145)
Total Bilirubin: 0.5 mg/dL (ref 0.3–1.2)
Total Bilirubin: 0.4 mg/dL (ref 0.3–1.2)
Total Protein: 6.4 g/dL — ABNORMAL LOW (ref 6.5–8.1)
Total Protein: 6.8 g/dL (ref 6.5–8.1)

## 2020-04-15 LAB — CK
Total CK: 1406 U/L — ABNORMAL HIGH (ref 49–397)
Total CK: 19638 U/L — ABNORMAL HIGH (ref 49–397)

## 2020-04-15 NOTE — Discharge Instructions (Signed)
You were admitted with rhabdomyolysis. You were treated with IV fluids to help flush out the products of muscle breakdown. We monitored you by watching your CK level which has come down significantly. Your pain was treated with pain medication. You should follow up with your pediatrician prior to resuming your athletic activities. It was a pleasure meeting you. Please find below some information about rhabdomyolysis.    Rhabdomyolysis Rhabdomyolysis is a condition that happens when muscle cells break down and release substances into the blood that can damage the kidneys. This condition happens because of damage to the muscles that move bones (skeletal muscle). When the skeletal muscles are damaged, substances inside the muscle cells go into the blood. One of these substances is a protein called myoglobin. Large amounts of myoglobin can cause kidney damage or kidney failure. Other substances that are released by muscle cells may upset the balance of the minerals (electrolytes) in your blood. This imbalance causes your blood to have too much acid (acidosis). What are the causes? This condition is caused by muscle damage. Muscle damage often happens because of:  Using your muscles too much.  An injury that crushes or squeezes a muscle too tightly.  Using illegal drugs, mainly cocaine.  Alcohol abuse. Other possible causes include:  Prescription medicines, such as those that: ? Lower cholesterol (statins). ? Treat ADHD (attention deficit hyperactivity disorder) or help with weight loss (amphetamines). ? Treat pain (opiates).  Infections.  Muscle diseases that are passed down from parent to child (inherited).  High fever.  Heatstroke.  Not having enough fluids in your body (dehydration).  Seizures.  Surgery. What increases the risk? This condition is more likely to develop in people who:  Have a family history of muscle disease.  Take part in extreme sports, such as running in  marathons.  Have diabetes.  Are older.  Abuse drugs or alcohol. What are the signs or symptoms? Symptoms of this condition vary. Some people have very few symptoms, and other people have many symptoms. The most common symptoms include:  Muscle pain and swelling.  Weak muscles.  Dark urine.  Feeling weak and tired. Other symptoms include:  Nausea and vomiting.  Fever.  Pain in the abdomen.  Pain in the joints. Symptoms of complications from this condition include:  Heart rhythm that is not normal (arrhythmia).  Seizures.  Not urinating enough because of kidney failure.  Very low blood pressure (shock). Signs of shock include dizziness, blurry vision, and clammy skin.  Bleeding that is hard to stop or control. How is this diagnosed? This condition is diagnosed based on your medical history, your symptoms, and a physical exam. Tests may also be done, including:  Blood tests.  Urine tests to check for myoglobin. You may also have other tests to check for causes of muscle damage and to check for complications. How is this treated? Treatment for this condition helps to:  Make sure you have enough fluids in your body.  Lower the acid levels in your blood to reverse acidosis.  Protect your kidneys. Treatment may include:  Fluids and medicines given through an IV tube that is inserted into one of your veins.  Medicines to lower acidosis or to bring back the balance of the minerals in your body.  Hemodialysis. This treatment uses an artificial kidney machine to filter your blood while you recover. You may have this if other treatments are not helping. Follow these instructions at home:   Take over-the-counter and prescription medicines only  as told by your health care provider.  Rest at home until your health care provider says that you can return to your normal activities.  Drink enough fluid to keep your urine clear or pale yellow.  Do not do activities  that take a lot of effort (are strenuous). Ask your health care provider what level of exercise is safe for you.  Do not abuse drugs or alcohol. If you are having problems with drug or alcohol use, ask your health care provider for help.  Keep all follow-up visits as told by your health care provider. This is important. Contact a health care provider if:  You start having symptoms of this condition after treatment. Get help right away if:  You have a seizure.  You bleed easily or cannot control bleeding.  You cannot urinate.  You have chest pain.  You have trouble breathing. This information is not intended to replace advice given to you by your health care provider. Make sure you discuss any questions you have with your health care provider. Document Revised: 10/15/2017 Document Reviewed: 08/14/2016 Elsevier Patient Education  2020 [employer].

## 2020-04-15 NOTE — Discharge Summary (Signed)
Name: Gregory Stevens MRN: [id number] DOB: 2000-12-22 19 y.o. PCP: Renae Gloss, MD  Date of Admission: 04/09/2020 11:51 PM Date of Discharge:  04/18/20 Attending Physician: Anne Shutter, MD  Discharge Diagnosis:  1. Rhabdomyolysis  Discharge Medications: Allergies as of 04/18/2020      Reactions   Kiwi Extract Swelling   Tongue and throat swell   Molds & Smuts Rash      Medication List    STOP taking these medications   cyclobenzaprine 5 MG tablet Commonly known as: Flexeril   ibuprofen 400 MG tablet Commonly known as: ADVIL   ibuprofen 600 MG tablet Commonly known as: ADVIL     TAKE these medications   albuterol 108 (90 Base) MCG/ACT inhaler Commonly known as: VENTOLIN HFA Inhale 2 puffs into the lungs daily as needed for wheezing or shortness of breath.   loratadine 10 MG tablet Commonly known as: CLARITIN Take 10 mg by mouth daily.        Disposition and follow-up:   Mr.Gregory Stevens was discharged from [facility] in Good condition.  At the hospital follow up visit please address:   1.  Please ensure patient continues to be pain free. Please address appropriate return to athletics and weight training. Please reinforce importance of proper hydration and avoidance of dangerous supplements such as creatine. Please check CK and CMP.  2.  Labs / imaging needed at time of follow-up: CK/CMP  3.  Pending labs/ test needing follow-up: na  Follow-up Appointments: Follow-up Information    Renae Gloss, MD. Schedule an appointment as soon as possible for a visit in 1 week(s).   Contact information: [address] [place] [address] [phone]           Hospital Course by problem list:  1. 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 bilateral upper extremity pain found to have rhabdomyolysis with CK of 45K -he reports the pain started after a "bicep burn" workup; he reports he  tries to drink water regularly but thinks he does not stay adequately hydrated; he also reports significant sweating  -mom somewhat concerned that this could have been a side effect of his COVID vaccine; he is s/p one dose of pfizer which he received on 04/01/2020; there have been some case studies; more likely this was due to exercise -pain controlled with oxy/morphine during admission -received 400 ml/hr with output of 200 ml/hr -creatinine stable throughout admission -CK down to 6,054 -AST/ALT elevated, likely sequela of muscle breakdown, AST downtrending, 96 and 167 respectively -other workup include HIV and UDS which were both negative -needs close outpatient follow up with pediatrician  Discharge Vitals:   BP 128/76 (BP Location: Right Arm)   Pulse 68   Temp 99 F (37.2 C) (Oral)   Resp 18   Ht 5\' 5"  (1.651 m)   Wt 69.7 kg   SpO2 100%   BMI 25.56 kg/m   Pertinent Labs, Studies, and Procedures:  Results for orders placed or performed during the hospital encounter of 04/09/20 (from the past 168 hour(s))  Renal function panel   Collection Time: 04/11/20  2:13 PM  Result Value Ref Range   Sodium 141 135 - 145 mmol/L   Potassium 4.0 3.5 - 5.1 mmol/L   Chloride 105 98 - 111 mmol/L   CO2 25 22 - 32 mmol/L   Glucose, Bld 99 70 - 99 mg/dL   BUN 10 6 - 20  mg/dL   Creatinine, Ser 1.610.97 0.61 - 1.24 mg/dL   Calcium 9.2 8.9 - 09.610.3 mg/dL   Phosphorus 4.3 2.5 - 4.6 mg/dL   Albumin 3.4 (L) 3.5 - 5.0 g/dL   GFR calc non Af Amer >60 >60 mL/min   GFR calc Af Amer >60 >60 mL/min   Anion gap 11 5 - 15  CK   Collection Time: 04/11/20  5:41 PM  Result Value Ref Range   Total CK >50,000 (H) 49 - 397 U/L  Renal function panel   Collection Time: 04/11/20  5:41 PM  Result Value Ref Range   Sodium 141 135 - 145 mmol/L   Potassium 3.8 3.5 - 5.1 mmol/L   Chloride 104 98 - 111 mmol/L   CO2 27 22 - 32 mmol/L   Glucose, Bld 81 70 - 99 mg/dL   BUN 9 6 - 20 mg/dL   Creatinine, Ser 0.451.00 0.61 -  1.24 mg/dL   Calcium 9.4 8.9 - 40.910.3 mg/dL   Phosphorus 4.6 2.5 - 4.6 mg/dL   Albumin 3.7 3.5 - 5.0 g/dL   GFR calc non Af Amer >60 >60 mL/min   GFR calc Af Amer >60 >60 mL/min   Anion gap 10 5 - 15  Urinalysis, Routine w reflex microscopic   Collection Time: 04/11/20  6:30 PM  Result Value Ref Range   Color, Urine COLORLESS (A) YELLOW   APPearance CLEAR CLEAR   Specific Gravity, Urine 1.008 1.005 - 1.030   pH 9.0 (H) 5.0 - 8.0   Glucose, UA NEGATIVE NEGATIVE mg/dL   Hgb urine dipstick MODERATE (A) NEGATIVE   Bilirubin Urine NEGATIVE NEGATIVE   Ketones, ur NEGATIVE NEGATIVE mg/dL   Protein, ur NEGATIVE NEGATIVE mg/dL   Nitrite NEGATIVE NEGATIVE   Leukocytes,Ua NEGATIVE NEGATIVE   RBC / HPF 0-5 0 - 5 RBC/hpf   WBC, UA 0-5 0 - 5 WBC/hpf   Bacteria, UA NONE SEEN NONE SEEN  Comprehensive metabolic panel   Collection Time: 04/12/20  6:00 AM  Result Value Ref Range   Sodium 141 135 - 145 mmol/L   Potassium 4.0 3.5 - 5.1 mmol/L   Chloride 101 98 - 111 mmol/L   CO2 30 22 - 32 mmol/L   Glucose, Bld 92 70 - 99 mg/dL   BUN 7 6 - 20 mg/dL   Creatinine, Ser 8.110.90 0.61 - 1.24 mg/dL   Calcium 9.4 8.9 - 91.410.3 mg/dL   Total Protein 5.8 (L) 6.5 - 8.1 g/dL   Albumin 3.3 (L) 3.5 - 5.0 g/dL   AST 782522 (H) 15 - 41 U/L   ALT 184 (H) 0 - 44 U/L   Alkaline Phosphatase 40 38 - 126 U/L   Total Bilirubin 0.3 0.3 - 1.2 mg/dL   GFR calc non Af Amer >60 >60 mL/min   GFR calc Af Amer >60 >60 mL/min   Anion gap 10 5 - 15  CK   Collection Time: 04/12/20  6:00 AM  Result Value Ref Range   Total CK >50,000 (H) 49 - 397 U/L  CK   Collection Time: 04/13/20 12:53 PM  Result Value Ref Range   Total CK >50,000 (H) 49 - 397 U/L  Comprehensive metabolic panel   Collection Time: 04/13/20 12:53 PM  Result Value Ref Range   Sodium 143 135 - 145 mmol/L   Potassium 3.8 3.5 - 5.1 mmol/L   Chloride 103 98 - 111 mmol/L   CO2 29 22 - 32 mmol/L   Glucose, Bld  81 70 - 99 mg/dL   BUN 6 6 - 20 mg/dL   Creatinine,  Ser 8.25 0.61 - 1.24 mg/dL   Calcium 9.5 8.9 - 05.3 mg/dL   Total Protein 6.2 (L) 6.5 - 8.1 g/dL   Albumin 3.4 (L) 3.5 - 5.0 g/dL   AST 976 (H) 15 - 41 U/L   ALT 230 (H) 0 - 44 U/L   Alkaline Phosphatase 41 38 - 126 U/L   Total Bilirubin 0.5 0.3 - 1.2 mg/dL   GFR calc non Af Amer >60 >60 mL/min   GFR calc Af Amer >60 >60 mL/min   Anion gap 11 5 - 15  CK   Collection Time: 04/14/20  6:51 AM  Result Value Ref Range   Total CK 39,182 (H) 49 - 397 U/L  Comprehensive metabolic panel   Collection Time: 04/14/20  6:51 AM  Result Value Ref Range   Sodium 138 135 - 145 mmol/L   Potassium 3.5 3.5 - 5.1 mmol/L   Chloride 102 98 - 111 mmol/L   CO2 27 22 - 32 mmol/L   Glucose, Bld 118 (H) 70 - 99 mg/dL   BUN 5 (L) 6 - 20 mg/dL   Creatinine, Ser 7.34 0.61 - 1.24 mg/dL   Calcium 9.2 8.9 - 19.3 mg/dL   Total Protein 5.7 (L) 6.5 - 8.1 g/dL   Albumin 3.2 (L) 3.5 - 5.0 g/dL   AST 790 (H) 15 - 41 U/L   ALT 223 (H) 0 - 44 U/L   Alkaline Phosphatase 41 38 - 126 U/L   Total Bilirubin 0.6 0.3 - 1.2 mg/dL   GFR calc non Af Amer >60 >60 mL/min   GFR calc Af Amer >60 >60 mL/min   Anion gap 9 5 - 15  CK   Collection Time: 04/15/20  2:02 AM  Result Value Ref Range   Total CK 1,406 (H) 49 - 397 U/L  Comprehensive metabolic panel   Collection Time: 04/15/20  2:02 AM  Result Value Ref Range   Sodium 141 135 - 145 mmol/L   Potassium 3.9 3.5 - 5.1 mmol/L   Chloride 101 98 - 111 mmol/L   CO2 25 22 - 32 mmol/L   Glucose, Bld 89 70 - 99 mg/dL   BUN 9 6 - 20 mg/dL   Creatinine, Ser 2.40 0.61 - 1.24 mg/dL   Calcium 9.5 8.9 - 97.3 mg/dL   Total Protein 6.4 (L) 6.5 - 8.1 g/dL   Albumin 3.6 3.5 - 5.0 g/dL   AST 532 (H) 15 - 41 U/L   ALT 250 (H) 0 - 44 U/L   Alkaline Phosphatase 44 38 - 126 U/L   Total Bilirubin 0.5 0.3 - 1.2 mg/dL   GFR calc non Af Amer >60 >60 mL/min   GFR calc Af Amer >60 >60 mL/min   Anion gap 15 5 - 15  CK   Collection Time: 04/15/20 10:34 AM  Result Value Ref Range   Total  CK 19,638 (H) 49 - 397 U/L  Comprehensive metabolic panel   Collection Time: 04/15/20 10:34 AM  Result Value Ref Range   Sodium 141 135 - 145 mmol/L   Potassium 3.8 3.5 - 5.1 mmol/L   Chloride 103 98 - 111 mmol/L   CO2 28 22 - 32 mmol/L   Glucose, Bld 87 70 - 99 mg/dL   BUN 7 6 - 20 mg/dL   Creatinine, Ser 9.92 0.61 - 1.24 mg/dL   Calcium 9.8 8.9 - 10.3  mg/dL   Total Protein 6.8 6.5 - 8.1 g/dL   Albumin 3.9 3.5 - 5.0 g/dL   AST 345 (H) 15 - 41 U/L   ALT 256 (H) 0 - 44 U/L   Alkaline Phosphatase 47 38 - 126 U/L   Total Bilirubin 0.4 0.3 - 1.2 mg/dL   GFR calc non Af Amer >60 >60 mL/min   GFR calc Af Amer >60 >60 mL/min   Anion gap 10 5 - 15  Comprehensive metabolic panel   Collection Time: 04/16/20  6:13 AM  Result Value Ref Range   Sodium 139 135 - 145 mmol/L   Potassium 3.8 3.5 - 5.1 mmol/L   Chloride 101 98 - 111 mmol/L   CO2 27 22 - 32 mmol/L   Glucose, Bld 92 70 - 99 mg/dL   BUN 7 6 - 20 mg/dL   Creatinine, Ser 0.94 0.61 - 1.24 mg/dL   Calcium 9.7 8.9 - 10.3 mg/dL   Total Protein 6.4 (L) 6.5 - 8.1 g/dL   Albumin 3.7 3.5 - 5.0 g/dL   AST 229 (H) 15 - 41 U/L   ALT 221 (H) 0 - 44 U/L   Alkaline Phosphatase 40 38 - 126 U/L   Total Bilirubin 0.7 0.3 - 1.2 mg/dL   GFR calc non Af Amer >60 >60 mL/min   GFR calc Af Amer >60 >60 mL/min   Anion gap 11 5 - 15  CK   Collection Time: 04/16/20  6:13 AM  Result Value Ref Range   Total CK 19,920 (H) 49 - 397 U/L  CK   Collection Time: 04/16/20  3:10 PM  Result Value Ref Range   Total CK 19,393 (H) 49 - 397 U/L  Urine rapid drug screen (hosp performed)   Collection Time: 04/17/20  7:11 AM  Result Value Ref Range   Opiates NONE DETECTED NONE DETECTED   Cocaine NONE DETECTED NONE DETECTED   Benzodiazepines NONE DETECTED NONE DETECTED   Amphetamines NONE DETECTED NONE DETECTED   Tetrahydrocannabinol NONE DETECTED NONE DETECTED   Barbiturates NONE DETECTED NONE DETECTED  Comprehensive metabolic panel   Collection Time:  04/17/20  8:04 AM  Result Value Ref Range   Sodium 140 135 - 145 mmol/L   Potassium 4.0 3.5 - 5.1 mmol/L   Chloride 101 98 - 111 mmol/L   CO2 28 22 - 32 mmol/L   Glucose, Bld 83 70 - 99 mg/dL   BUN 12 6 - 20 mg/dL   Creatinine, Ser 0.97 0.61 - 1.24 mg/dL   Calcium 9.5 8.9 - 10.3 mg/dL   Total Protein 6.5 6.5 - 8.1 g/dL   Albumin 3.7 3.5 - 5.0 g/dL   AST 143 (H) 15 - 41 U/L   ALT 189 (H) 0 - 44 U/L   Alkaline Phosphatase 41 38 - 126 U/L   Total Bilirubin 0.6 0.3 - 1.2 mg/dL   GFR calc non Af Amer >60 >60 mL/min   GFR calc Af Amer >60 >60 mL/min   Anion gap 11 5 - 15  CK   Collection Time: 04/17/20  8:04 AM  Result Value Ref Range   Total CK 11,332 (H) 49 - 397 U/L  HIV Antibody (routine testing w rflx)   Collection Time: 04/17/20  8:04 AM  Result Value Ref Range   HIV Screen 4th Generation wRfx Non Reactive Non Reactive  CK   Collection Time: 04/17/20  6:36 PM  Result Value Ref Range   Total CK 8,911 (H) 49 -  397 U/L  CK   Collection Time: 04/18/20  3:17 AM  Result Value Ref Range   Total CK 12,490 (H) 49 - 397 U/L  Comprehensive metabolic panel   Collection Time: 04/18/20  3:17 AM  Result Value Ref Range   Sodium 141 135 - 145 mmol/L   Potassium 3.7 3.5 - 5.1 mmol/L   Chloride 102 98 - 111 mmol/L   CO2 27 22 - 32 mmol/L   Glucose, Bld 92 70 - 99 mg/dL   BUN 14 6 - 20 mg/dL   Creatinine, Ser 3.53 0.61 - 1.24 mg/dL   Calcium 9.5 8.9 - 29.9 mg/dL   Total Protein 6.3 (L) 6.5 - 8.1 g/dL   Albumin 3.6 3.5 - 5.0 g/dL   AST 96 (H) 15 - 41 U/L   ALT 167 (H) 0 - 44 U/L   Alkaline Phosphatase 39 38 - 126 U/L   Total Bilirubin 0.5 0.3 - 1.2 mg/dL   GFR calc non Af Amer >60 >60 mL/min   GFR calc Af Amer >60 >60 mL/min   Anion gap 12 5 - 15  CK   Collection Time: 04/18/20 12:22 PM  Result Value Ref Range   Total CK 6,054 (H) 49 - 397 U/L   No results found.  Discharge Instructions: Discharge Instructions    Diet general   Complete by: As directed    Increase  activity slowly   Complete by: As directed    Please postpone athletic activities until cleared by your pediatrician.      Signed: Jenell Milliner, MD 04/18/2020, 2:02 PM   Pager: [phone]

## 2020-04-15 NOTE — Progress Notes (Signed)
Subjective:  Patient feeling well this morning. Discussed that his CK had come down significantly and that we want to double check it and then he can likely go home later today.   Consults: na  Objective:  Vital signs in last 24 hours: Vitals:   04/14/20 1247 04/14/20 1817 04/14/20 2340 04/15/20 0508  BP: (!) 143/70 (!) 152/82 (!) 150/90 131/61  Pulse: 60 (!) 56 (!) 53 (!) 51  Resp: 18 16 17 17   Temp: 98.1 F (36.7 C) 98.5 F (36.9 C) 98.3 F (36.8 C) 97.7 F (36.5 C)  TempSrc: Oral Oral Oral Oral  SpO2: 100% 100% 100% 100%  Weight:    69.6 kg  Height:       Physical Exam  Constitutional: He is well-developed, well-nourished, and in no distress. No distress.  HENT:  Head: Normocephalic and atraumatic.  Cardiovascular: Normal rate, regular rhythm and intact distal pulses.  No murmur heard. Pulmonary/Chest: Effort normal and breath sounds normal. No respiratory distress.  Musculoskeletal:        General: Normal range of motion.     Comments: Edematous RUE, skin taught, non TTP  Neurological: He is alert.  Normal strength and sensation in upper extremities  Skin: Skin is warm and dry. He is not diaphoretic.  Psychiatric: Affect normal.  Nursing note and vitals reviewed.  I/Os:  Intake/Output Summary (Last 24 hours) at 04/15/2020 0930 Last data filed at 04/15/2020 0844 Gross per 24 hour  Intake 24445.91 ml  Output 7050 ml  Net 17395.91 ml   Telemetry:  Labs: Results for orders placed or performed during the hospital encounter of 04/09/20 (from the past 24 hour(s))  CK     Status: Abnormal   Collection Time: 04/15/20  2:02 AM  Result Value Ref Range   Total CK 1,406 (H) 49 - 397 U/L  Comprehensive metabolic panel     Status: Abnormal   Collection Time: 04/15/20  2:02 AM  Result Value Ref Range   Sodium 141 135 - 145 mmol/L   Potassium 3.9 3.5 - 5.1 mmol/L   Chloride 101 98 - 111 mmol/L   CO2 25 22 - 32 mmol/L   Glucose, Bld 89 70 - 99 mg/dL   BUN 9 6 - 20  mg/dL   Creatinine, Ser 0.91 0.61 - 1.24 mg/dL   Calcium 9.5 8.9 - 10.3 mg/dL   Total Protein 6.4 (L) 6.5 - 8.1 g/dL   Albumin 3.6 3.5 - 5.0 g/dL   AST 390 (H) 15 - 41 U/L   ALT 250 (H) 0 - 44 U/L   Alkaline Phosphatase 44 38 - 126 U/L   Total Bilirubin 0.5 0.3 - 1.2 mg/dL   GFR calc non Af Amer >60 >60 mL/min   GFR calc Af Amer >60 >60 mL/min   Anion gap 15 5 - 15   Imaging:  No results found.   Assessment/Plan:  Assessment: Mr. Fill is an 19yo M w/ exercise induced asthma who presented to the ED with 2-3 days of progressive bilateral arm pain found to have an elevated CK of around 45K here for continued treatment and management of rhabdomyolysis.   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 upper extremity pain found to have rhabdomyolysis with CK of 45K -he reports the pain started after a "bicep burn" workup; he reports he tries to drink water regularly but think he does not stay adequately hydrated; he also report significant sweating  -  mom somewhat concerned that this could have been a side effect of his COVID vaccine; he is s/p one dose of pfizer which he received on 04/01/2020; there have been some case studies; more likely this was due to exercise -receiving 425 ml/hr  -urinating at least 200 ml/hr -creatinine stable at 0.91 -CK down from >50,000 to 39,182 to 1,406 today -AST/ALT elevated, likely sequela of muscle breakdown, AST downtrending, 390 today, and ALT bumped to 250 today   Plan: -stop IV fluids -recheck CK and CMP prior to discharge  Active Problems:   Exercise-induced asthma   Environmental and seasonal allergies -albuterol PRN  Dispo: Anticipated discharge today.  Jenell Milliner, MD 04/15/2020, 9:30 AM

## 2020-04-16 LAB — COMPREHENSIVE METABOLIC PANEL
ALT: 221 U/L — ABNORMAL HIGH (ref 0–44)
AST: 229 U/L — ABNORMAL HIGH (ref 15–41)
Albumin: 3.7 g/dL (ref 3.5–5.0)
Alkaline Phosphatase: 40 U/L (ref 38–126)
Anion gap: 11 (ref 5–15)
BUN: 7 mg/dL (ref 6–20)
CO2: 27 mmol/L (ref 22–32)
Calcium: 9.7 mg/dL (ref 8.9–10.3)
Chloride: 101 mmol/L (ref 98–111)
Creatinine, Ser: 0.94 mg/dL (ref 0.61–1.24)
GFR calc Af Amer: >60 mL/min (ref >60)
GFR calc non Af Amer: >60 mL/min (ref >60)
Glucose, Bld: 92 mg/dL (ref 70–99)
Potassium: 3.8 mmol/L (ref 3.5–5.1)
Sodium: 139 mmol/L (ref 135–145)
Total Bilirubin: 0.7 mg/dL (ref 0.3–1.2)
Total Protein: 6.4 g/dL — ABNORMAL LOW (ref 6.5–8.1)

## 2020-04-16 LAB — CK
Total CK: 19920 U/L — ABNORMAL HIGH (ref 49–397)
Total CK: 19393 U/L — ABNORMAL HIGH (ref 49–397)

## 2020-04-16 NOTE — Progress Notes (Signed)
Subjective:  Patient feeling well this morning. We discussed his CK was the same as yesterday afternoon so we want to continue fluids. He was amenable to that. No other complaints or concerns.  Consults: na  Objective:  Vital signs in last 24 hours: Vitals:   04/15/20 1242 04/15/20 1754 04/15/20 2347 04/16/20 0500  BP: (!) 152/95 (!) 137/94 137/86 136/89  Pulse: (!) 52 (!) 55 (!) 58 (!) 53  Resp: 18 18 18 18   Temp: 98.4 F (36.9 C) 98 F (36.7 C) 98.1 F (36.7 C) 97.7 F (36.5 C)  TempSrc: Oral Oral Oral Oral  SpO2: 100% 99% 100% 100%  Weight:    69.1 kg  Height:       Physical Exam  Constitutional: He is well-developed, well-nourished, and in no distress. No distress.  HENT:  Head: Normocephalic and atraumatic.  Cardiovascular: Normal rate, regular rhythm and intact distal pulses.  No murmur heard. Pulmonary/Chest: Effort normal and breath sounds normal. No respiratory distress.  Musculoskeletal:        General: Normal range of motion.  Neurological: He is alert.  Normal strength and sensation in upper extremities  Skin: Skin is warm and dry. He is not diaphoretic.  Psychiatric: Affect normal.  Nursing note and vitals reviewed.  I/Os:  Intake/Output Summary (Last 24 hours) at 04/16/2020 0953 Last data filed at 04/16/2020 0837 Gross per 24 hour  Intake 3918.57 ml  Output 7550 ml  Net -3631.43 ml   Telemetry:  Labs: Results for orders placed or performed during the hospital encounter of 04/09/20 (from the past 24 hour(s))  CK     Status: Abnormal   Collection Time: 04/15/20 10:34 AM  Result Value Ref Range   Total CK 19,638 (H) 49 - 397 U/L  Comprehensive metabolic panel     Status: Abnormal   Collection Time: 04/15/20 10:34 AM  Result Value Ref Range   Sodium 141 135 - 145 mmol/L   Potassium 3.8 3.5 - 5.1 mmol/L   Chloride 103 98 - 111 mmol/L   CO2 28 22 - 32 mmol/L   Glucose, Bld 87 70 - 99 mg/dL   BUN 7 6 - 20 mg/dL   Creatinine, Ser 1.07 0.61 - 1.24  mg/dL   Calcium 9.8 8.9 - 10.3 mg/dL   Total Protein 6.8 6.5 - 8.1 g/dL   Albumin 3.9 3.5 - 5.0 g/dL   AST 345 (H) 15 - 41 U/L   ALT 256 (H) 0 - 44 U/L   Alkaline Phosphatase 47 38 - 126 U/L   Total Bilirubin 0.4 0.3 - 1.2 mg/dL   GFR calc non Af Amer >60 >60 mL/min   GFR calc Af Amer >60 >60 mL/min   Anion gap 10 5 - 15  Comprehensive metabolic panel     Status: Abnormal   Collection Time: 04/16/20  6:13 AM  Result Value Ref Range   Sodium 139 135 - 145 mmol/L   Potassium 3.8 3.5 - 5.1 mmol/L   Chloride 101 98 - 111 mmol/L   CO2 27 22 - 32 mmol/L   Glucose, Bld 92 70 - 99 mg/dL   BUN 7 6 - 20 mg/dL   Creatinine, Ser 0.94 0.61 - 1.24 mg/dL   Calcium 9.7 8.9 - 10.3 mg/dL   Total Protein 6.4 (L) 6.5 - 8.1 g/dL   Albumin 3.7 3.5 - 5.0 g/dL   AST 229 (H) 15 - 41 U/L   ALT 221 (H) 0 - 44 U/L  Alkaline Phosphatase 40 38 - 126 U/L   Total Bilirubin 0.7 0.3 - 1.2 mg/dL   GFR calc non Af Amer >60 >60 mL/min   GFR calc Af Amer >60 >60 mL/min   Anion gap 11 5 - 15  CK     Status: Abnormal   Collection Time: 04/16/20  6:13 AM  Result Value Ref Range   Total CK 19,920 (H) 49 - 397 U/L   Imaging:  No results found.   Assessment/Plan:  Assessment: Gregory Stevens is an 19yo M w/ exercise induced asthma who presented to the ED with 2-3 days of progressive bilateral arm pain found to have an elevated CK of around 45K here for continued treatment and management of rhabdomyolysis.   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 upper extremity pain found to have rhabdomyolysis with CK of 45K -he reports the pain started after a "bicep burn" workup; he reports he tries to drink water regularly but think he does not stay adequately hydrated; he also report significant sweating  -mom somewhat concerned that this could have been a side effect of his COVID vaccine; he is s/p one dose of pfizer which he received on  04/01/2020; there have been some case studies; more likely this was due to exercise -receiving 400 ml/hr  -urinating at least 200 ml/hr -creatinine stable at 0.94 -CK 19,920 today, unchanged from yesterday but no concerns for compartment syndrome and patient with resolution of sx that brought him in -AST/ALT elevated, likely sequela of muscle breakdown, AST and ALT downtrending, 229 and 221  Plan: -continue fluids  -recheck CK this afternoon -CK and CMP in the am  Active Problems:   Exercise-induced asthma   Environmental and seasonal allergies -albuterol PRN  Dispo: Anticipated discharge in 1-2 days.  Jenell Milliner, MD 04/16/2020, 9:53 AM

## 2020-04-17 LAB — COMPREHENSIVE METABOLIC PANEL
ALT: 189 U/L — ABNORMAL HIGH (ref 0–44)
AST: 143 U/L — ABNORMAL HIGH (ref 15–41)
Albumin: 3.7 g/dL (ref 3.5–5.0)
Alkaline Phosphatase: 41 U/L (ref 38–126)
Anion gap: 11 (ref 5–15)
BUN: 12 mg/dL (ref 6–20)
CO2: 28 mmol/L (ref 22–32)
Calcium: 9.5 mg/dL (ref 8.9–10.3)
Chloride: 101 mmol/L (ref 98–111)
Creatinine, Ser: 0.97 mg/dL (ref 0.61–1.24)
GFR calc Af Amer: >60 mL/min (ref >60)
GFR calc non Af Amer: >60 mL/min (ref >60)
Glucose, Bld: 83 mg/dL (ref 70–99)
Potassium: 4 mmol/L (ref 3.5–5.1)
Sodium: 140 mmol/L (ref 135–145)
Total Bilirubin: 0.6 mg/dL (ref 0.3–1.2)
Total Protein: 6.5 g/dL (ref 6.5–8.1)

## 2020-04-17 LAB — RAPID URINE DRUG SCREEN, HOSP PERFORMED
Amphetamines: NOT DETECTED
Barbiturates: NOT DETECTED
Benzodiazepines: NOT DETECTED
Cocaine: NOT DETECTED
Opiates: NOT DETECTED
Tetrahydrocannabinol: NOT DETECTED

## 2020-04-17 LAB — CK
Total CK: 11332 U/L — ABNORMAL HIGH (ref 49–397)
Total CK: 8911 U/L — ABNORMAL HIGH (ref 49–397)

## 2020-04-17 LAB — HIV ANTIBODY (ROUTINE TESTING W REFLEX): HIV Screen 4th Generation wRfx: NONREACTIVE

## 2020-04-17 NOTE — Progress Notes (Signed)
Subjective:  Patient continues feeling well this morning. We discussed his CK level and the current plan. He was amenable to that. No other complaints or concerns.  Patient states that he is currently not in any pain. He denies working out, illicit drugs, or supplements in the hospital. I counseled him regarding our concern regarding his persistently elevated CK level. He does not feel like his his extremities are more swollen than usual. 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  Consults: na  Objective:  Vital signs in last 24 hours: Vitals:   04/16/20 1804 04/17/20 0014 04/17/20 0500 04/17/20 0533  BP: (!) 160/90 (!) 154/113  (!) 149/94  Pulse: (!) 58 (!) 53  (!) 50  Resp:  16  17  Temp: 97.6 F (36.4 C) 98 F (36.7 C)  98 F (36.7 C)  TempSrc: Oral Oral  Oral  SpO2: 100% 100%  100%  Weight:   69.8 kg 69.7 kg  Height:       Physical Exam  Constitutional: He is well-developed, well-nourished, and in no distress. No distress.  HENT:  Head: Normocephalic and atraumatic.  Cardiovascular: Normal rate, regular rhythm and intact distal pulses.  No murmur heard. Pulmonary/Chest: Effort normal and breath sounds normal. No respiratory distress.  Musculoskeletal:        General: Normal range of motion.  Neurological: He is alert.  Normal strength and sensation in upper extremities  Skin: Skin is warm and dry. He is not diaphoretic.  Psychiatric: Affect normal.  Nursing note and vitals reviewed.  I/Os:  Intake/Output Summary (Last 24 hours) at 04/17/2020 1010 Last data filed at 04/17/2020 0900 Gross per 24 hour  Intake 5149.98 ml  Output 8625 ml  Net -3475.02 ml   Telemetry:  Labs: Results for orders placed or performed during the hospital encounter of 04/09/20 (from the past 24 hour(s))  CK     Status: Abnormal   Collection Time: 04/16/20  3:10 PM  Result Value Ref Range   Total CK 19,393 (H) 49 - 397 U/L  Urine rapid drug screen  (hosp performed)     Status: None   Collection Time: 04/17/20  7:11 AM  Result Value Ref Range   Opiates NONE DETECTED NONE DETECTED   Cocaine NONE DETECTED NONE DETECTED   Benzodiazepines NONE DETECTED NONE DETECTED   Amphetamines NONE DETECTED NONE DETECTED   Tetrahydrocannabinol NONE DETECTED NONE DETECTED   Barbiturates NONE DETECTED NONE DETECTED  Comprehensive metabolic panel     Status: Abnormal   Collection Time: 04/17/20  8:04 AM  Result Value Ref Range   Sodium 140 135 - 145 mmol/L   Potassium 4.0 3.5 - 5.1 mmol/L   Chloride 101 98 - 111 mmol/L   CO2 28 22 - 32 mmol/L   Glucose, Bld 83 70 - 99 mg/dL   BUN 12 6 - 20 mg/dL   Creatinine, Ser 8.31 0.61 - 1.24 mg/dL   Calcium 9.5 8.9 - 51.7 mg/dL   Total Protein 6.5 6.5 - 8.1 g/dL   Albumin 3.7 3.5 - 5.0 g/dL   AST 616 (H) 15 - 41 U/L   ALT 189 (H) 0 - 44 U/L   Alkaline Phosphatase 41 38 - 126 U/L   Total Bilirubin 0.6 0.3 - 1.2 mg/dL   GFR calc non Af Amer >60 >60 mL/min   GFR calc Af Amer >60 >60 mL/min   Anion gap 11 5 - 15  CK  Status: Abnormal   Collection Time: 04/17/20  8:04 AM  Result Value Ref Range   Total CK 11,332 (H) 49 - 397 U/L   Imaging:  No results found.   Assessment/Plan:  Assessment: Mr. Gallardo is an 19yo M w/ exercise induced asthma who presented to the ED with 2-3 days of progressive bilateral arm pain found to have an elevated CK of around 45K here for continued treatment and management of rhabdomyolysis.   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 upper extremity pain found to have rhabdomyolysis with CK of 45K -he reports the pain started after a "bicep burn" workup; he reports he tries to drink water regularly but think he does not stay adequately hydrated; he also report significant sweating  -mom somewhat concerned that this could have been a side effect of his COVID vaccine; he is s/p one dose of pfizer  which he received on 04/01/2020; there have been some case studies; more likely this was due to exercise -receiving 400 ml/hr  -urinating at least 200 ml/hr -creatinine stable at 0.97 -CK 11,332 today, improved from yesterday -AST/ALT elevated, likely sequela of muscle breakdown, AST and ALT downtrending, 143 and 189  Plan: -continue fluids  -CK and CMP in the am  Active Problems:   Exercise-induced asthma   Environmental and seasonal allergies -albuterol PRN  Dispo: Anticipated discharge in 1-2 days.  Al Decant, MD 04/17/2020, 10:10 AM

## 2020-04-18 ENCOUNTER — Encounter (HOSPITAL_COMMUNITY): Payer: Self-pay | Admitting: Internal Medicine

## 2020-04-18 LAB — COMPREHENSIVE METABOLIC PANEL
ALT: 167 U/L — ABNORMAL HIGH (ref 0–44)
AST: 96 U/L — ABNORMAL HIGH (ref 15–41)
Albumin: 3.6 g/dL (ref 3.5–5.0)
Alkaline Phosphatase: 39 U/L (ref 38–126)
Anion gap: 12 (ref 5–15)
BUN: 14 mg/dL (ref 6–20)
CO2: 27 mmol/L (ref 22–32)
Calcium: 9.5 mg/dL (ref 8.9–10.3)
Chloride: 102 mmol/L (ref 98–111)
Creatinine, Ser: 0.94 mg/dL (ref 0.61–1.24)
GFR calc Af Amer: >60 mL/min (ref >60)
GFR calc non Af Amer: >60 mL/min (ref >60)
Glucose, Bld: 92 mg/dL (ref 70–99)
Potassium: 3.7 mmol/L (ref 3.5–5.1)
Sodium: 141 mmol/L (ref 135–145)
Total Bilirubin: 0.5 mg/dL (ref 0.3–1.2)
Total Protein: 6.3 g/dL — ABNORMAL LOW (ref 6.5–8.1)

## 2020-04-18 LAB — CK
Total CK: 12490 U/L — ABNORMAL HIGH (ref 49–397)
Total CK: 6054 U/L — ABNORMAL HIGH (ref 49–397)

## 2020-04-18 NOTE — Progress Notes (Signed)
Subjective:  Patient continues feeling well this morning. We discussed his CK level and the current plan. He was amenable to that. We discussed that his R arm looks less swollen today and he agreed. No other complaints or concerns.  Consults: na  Objective:  Vital signs in last 24 hours: Vitals:   04/17/20 1234 04/17/20 1736 04/17/20 2307 04/18/20 0526  BP: (!) 153/87 (!) 133/50 (!) 148/101 129/71  Pulse: (!) 56 (!) 56 60 (!) 51  Resp: 18 18 18 16   Temp: 98.5 F (36.9 C) 97.9 F (36.6 C) 98.5 F (36.9 C) 98.3 F (36.8 C)  TempSrc: Oral Oral Oral Oral  SpO2: 100% 98% 100% 100%  Weight:      Height:       Physical Exam  Constitutional: He is well-developed, well-nourished, and in no distress. No distress.  HENT:  Head: Normocephalic and atraumatic.  Cardiovascular: Normal rate, regular rhythm and intact distal pulses.  No murmur heard. Pulmonary/Chest: Effort normal and breath sounds normal. No respiratory distress.  Musculoskeletal:        General: Normal range of motion.     Comments: R arm less taught and edematous today  Neurological: He is alert.  Skin: Skin is warm and dry. He is not diaphoretic.  Psychiatric: Affect normal.  Nursing note and vitals reviewed.  I/Os:  Intake/Output Summary (Last 24 hours) at 04/18/2020 1014 Last data filed at 04/18/2020 0913 Gross per 24 hour  Intake 12285.23 ml  Output 5700 ml  Net 6585.23 ml   Telemetry:  Labs: Results for orders placed or performed during the hospital encounter of 04/09/20 (from the past 24 hour(s))  CK     Status: Abnormal   Collection Time: 04/17/20  6:36 PM  Result Value Ref Range   Total CK 8,911 (H) 49 - 397 U/L  CK     Status: Abnormal   Collection Time: 04/18/20  3:17 AM  Result Value Ref Range   Total CK 12,490 (H) 49 - 397 U/L  Comprehensive metabolic panel     Status: Abnormal   Collection Time: 04/18/20  3:17 AM  Result Value Ref Range   Sodium 141 135 - 145 mmol/L   Potassium 3.7 3.5 -  5.1 mmol/L   Chloride 102 98 - 111 mmol/L   CO2 27 22 - 32 mmol/L   Glucose, Bld 92 70 - 99 mg/dL   BUN 14 6 - 20 mg/dL   Creatinine, Ser 06/18/20 0.61 - 1.24 mg/dL   Calcium 9.5 8.9 - 3.76 mg/dL   Total Protein 6.3 (L) 6.5 - 8.1 g/dL   Albumin 3.6 3.5 - 5.0 g/dL   AST 96 (H) 15 - 41 U/L   ALT 167 (H) 0 - 44 U/L   Alkaline Phosphatase 39 38 - 126 U/L   Total Bilirubin 0.5 0.3 - 1.2 mg/dL   GFR calc non Af Amer >60 >60 mL/min   GFR calc Af Amer >60 >60 mL/min   Anion gap 12 5 - 15   Imaging:  No results found.   Assessment/Plan:  Assessment: Mr. Steib is an 19yo M w/ exercise induced asthma who presented to the ED with 2-3 days of progressive bilateral arm pain found to have an elevated CK of around 45K here for continued treatment and management of rhabdomyolysis.   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 upper extremity pain found to have rhabdomyolysis with CK of 45K -he  reports the pain started after a "bicep burn" workup; he reports he tries to drink water regularly but think he does not stay adequately hydrated; he also report significant sweating  -mom somewhat concerned that this could have been a side effect of his COVID vaccine; he is s/p one dose of pfizer which he received on 04/01/2020; there have been some case studies; more likely this was due to exercise -receiving 400 ml/hr  -urinating at least 200 ml/hr -creatinine stable at 0.94 -CK 11,332 yesterday to 12,490 today -AST/ALT elevated, likely sequela of muscle breakdown, AST and ALT downtrending, 96 and 167 -HIV negative and drug screen negative -R arm swelling has improved; initially patient said that was how his arms looked regularly, but skin was taught and appeared abnormal to [place]; good pulses, strength and sensation, however, so there has been no concern for compartment syndrome  -today R arm appears smaller and skin much less taught  and patient agrees but reports arms look smaller than usual to him now as he's not working out  -big picture trend is that his CK is continuing to come down    Plan: -continue fluids  -recheck CK today and if improved significantly may discharge today  Active Problems:   Exercise-induced asthma   Environmental and seasonal allergies -albuterol PRN  Dispo: Anticipated discharge pending clinical course.  Al Decant, MD 04/18/2020, 10:14 AM

## 2020-06-18 DIAGNOSIS — Z0100 Encounter for examination of eyes and vision without abnormal findings: Secondary | ICD-10-CM | POA: Diagnosis not present

## 2021-04-30 DIAGNOSIS — J3081 Allergic rhinitis due to animal (cat) (dog) hair and dander: Secondary | ICD-10-CM | POA: Diagnosis not present

## 2021-04-30 DIAGNOSIS — J301 Allergic rhinitis due to pollen: Secondary | ICD-10-CM | POA: Diagnosis not present

## 2021-04-30 DIAGNOSIS — J3089 Other allergic rhinitis: Secondary | ICD-10-CM | POA: Diagnosis not present

## 2021-04-30 DIAGNOSIS — J4520 Mild intermittent asthma, uncomplicated: Secondary | ICD-10-CM | POA: Diagnosis not present

## 2021-06-22 ENCOUNTER — Encounter (HOSPITAL_BASED_OUTPATIENT_CLINIC_OR_DEPARTMENT_OTHER): Payer: Self-pay

## 2021-06-22 ENCOUNTER — Other Ambulatory Visit: Payer: Self-pay

## 2021-06-22 ENCOUNTER — Emergency Department (HOSPITAL_BASED_OUTPATIENT_CLINIC_OR_DEPARTMENT_OTHER)
Admission: EM | Admit: 2021-06-22 | Discharge: 2021-06-22 | Disposition: A | Discharge status: Home / Self Care | Payer: 59 | Attending: Emergency Medicine | Admitting: Emergency Medicine

## 2021-06-22 DIAGNOSIS — M6282 Rhabdomyolysis: Secondary | ICD-10-CM | POA: Diagnosis not present

## 2021-06-22 DIAGNOSIS — T796XXA Traumatic ischemia of muscle, initial encounter: Secondary | ICD-10-CM | POA: Diagnosis not present

## 2021-06-22 DIAGNOSIS — J45909 Unspecified asthma, uncomplicated: Secondary | ICD-10-CM | POA: Diagnosis not present

## 2021-06-22 DIAGNOSIS — W500XXA Accidental hit or strike by another person, initial encounter: Secondary | ICD-10-CM | POA: Diagnosis not present

## 2021-06-22 DIAGNOSIS — Y9361 Activity, american tackle football: Secondary | ICD-10-CM | POA: Insufficient documentation

## 2021-06-22 LAB — COMPREHENSIVE METABOLIC PANEL
ALT: 28 U/L (ref 0–44)
AST: 34 U/L (ref 15–41)
Albumin: 4.9 g/dL (ref 3.5–5.0)
Alkaline Phosphatase: 44 U/L (ref 38–126)
Anion gap: 13 (ref 5–15)
BUN: 17 mg/dL (ref 6–20)
CO2: 27 mmol/L (ref 22–32)
Calcium: 9.6 mg/dL (ref 8.9–10.3)
Chloride: 97 mmol/L — ABNORMAL LOW (ref 98–111)
Creatinine, Ser: 1.4 mg/dL — ABNORMAL HIGH (ref 0.61–1.24)
GFR, Estimated: >60 mL/min (ref >60)
Glucose, Bld: 108 mg/dL — ABNORMAL HIGH (ref 70–99)
Potassium: 3.8 mmol/L (ref 3.5–5.1)
Sodium: 137 mmol/L (ref 135–145)
Total Bilirubin: 0.7 mg/dL (ref 0.3–1.2)
Total Protein: 7.6 g/dL (ref 6.5–8.1)

## 2021-06-22 LAB — CBC
HCT: 41.4 % (ref 39.0–52.0)
Hemoglobin: 13.8 g/dL (ref 13.0–17.0)
MCH: 27.4 pg (ref 26.0–34.0)
MCHC: 33.3 g/dL (ref 30.0–36.0)
MCV: 82.3 fL (ref 80.0–100.0)
Platelets: 222 10*3/uL (ref 150–400)
RBC: 5.03 MIL/uL (ref 4.22–5.81)
RDW: 13.2 % (ref 11.5–15.5)
WBC: 10.6 10*3/uL — ABNORMAL HIGH (ref 4.0–10.5)
nRBC: 0 % (ref 0.0–0.2)

## 2021-06-22 LAB — CK: Total CK: 805 U/L — ABNORMAL HIGH (ref 49–397)

## 2021-06-22 MED ORDER — ACETAMINOPHEN 500 MG PO TABS
ORAL_TABLET | ORAL | Status: AC
  Filled 2021-06-22: qty 2

## 2021-06-22 MED ORDER — ACETAMINOPHEN 500 MG PO TABS
1000.0000 mg | ORAL_TABLET | Freq: Once | ORAL | Status: AC
  Administered 2021-06-22: 1000 mg via ORAL

## 2021-06-22 NOTE — Discharge Instructions (Signed)
You were evaluated in the Emergency Department and after careful evaluation, we did not find any emergent condition requiring admission or further testing in the hospital.  Symptoms seem to be due to a mild case of rhabdomyolysis.  Recommend continued hydration at home, rest, avoiding any exertional activities or exercise, avoiding the heat, recommend repeat blood testing in 1 week.  If cannot find a primary care doctor during this time, you can return to the emergency department for repeat testing.  Please return to the Emergency Department if you experience any worsening of your condition.  Thank you for allowing [place] to be a part of your care.

## 2021-06-22 NOTE — ED Triage Notes (Addendum)
Pt had a football game Saturday night and states after the game he began to have 'spasms' on the left side of his back. Pt also had his right hand cramp and lock up a few times. Pt is concerned because the 'spasms' keep coming and going. Does not currently have any pain. Pt states he knows he has not been drinking as much as water as he should. Hx of rhabdomyolysis in 2021.

## 2021-06-22 NOTE — ED Provider Notes (Signed)
[facility] Emergency Department Provider Note MRN:  [id number]  Arrival date & time: 06/22/21     Chief Complaint   Spasms   History of Present Illness   Gregory Stevens is a 20 y.o. year-old male with a history of rhabdomyolysis presenting to the ED with chief complaint of body aches.  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  Endorsing continued soreness to the back, legs.  Denies numbness or weakness to the arms or legs, no bowel or bladder dysfunction, no significant head trauma, no headache or vision change, no chest pain or shortness of breath.  History of rhabdomyolysis.  Symptoms are moderate, constant, worse with motion or palpation.  Review of Systems  A complete 10 system review of systems was obtained and all systems are negative except as noted in the HPI and PMH.   Patient's Health History    Past Medical History:  Diagnosis Date   Asthma    Environmental and seasonal allergies 04/10/2020   Exercise-induced asthma 04/10/2020   Lazy eye    Raynaud's disease 04/10/2020   Rhabdomyolysis 2021    Past Surgical History:  Procedure Laterality Date   WISDOM TOOTH EXTRACTION  2019    Family History  Problem Relation Age of Onset   Hypertension Mother    Asthma Mother    Lichen planus Mother    Asthma Sister    Hypertension Maternal Grandmother    Non-Hodgkin's lymphoma Maternal Grandmother    Hypertension Maternal Grandfather    Hypercholesterolemia Maternal Grandfather    Prostate cancer Maternal Grandfather    Peripheral Artery Disease Maternal Grandfather    Hypertension Paternal Grandmother    Lung disease Paternal Grandmother    Lung cancer Paternal Grandfather     Social History   Socioeconomic History   Marital status: Single    Spouse name: Not on file   Number of children: Not on file   Years of education: Not on file   Highest  education level: Not on file  Occupational History   Not on file  Tobacco Use   Smoking status: Never   Smokeless tobacco: Never  Substance and Sexual Activity   Alcohol use: Yes   Drug use: Never   Sexual activity: Not on file  Other Topics Concern   Not on file  Social History Narrative   ** Merged History Encounter **       Social Determinants of Health   Financial Resource Strain: Not on file  Food Insecurity: Not on file  Transportation Needs: Not on file  Physical Activity: Not on file  Stress: Not on file  Social Connections: Not on file  Intimate Partner Violence: Not on file     Physical Exam   Vitals:   06/22/21 0121 06/22/21 0200  BP: (!) 123/57 124/79  Pulse: 76 81  Resp: 19 18  Temp: 98.5 F (36.9 C)   SpO2: 98% 97%    CONSTITUTIONAL: Well-appearing, NAD NEURO:  Alert and oriented x 3, no focal deficits EYES:  eyes equal and reactive ENT/NECK:  no LAD, no JVD CARDIO: Regular rate, well-perfused, normal S1 and S2 PULM:  CTAB no wheezing or rhonchi GI/GU:  normal bowel sounds, non-distended, non-tender MSK/SPINE:  No gross deformities, no edema SKIN:  no rash, atraumatic PSYCH:  Appropriate speech and behavior  *Additional and/or pertinent findings included in MDM below  Diagnostic and Interventional Summary  EKG Interpretation  Date/Time:    Ventricular Rate:    PR Interval:    QRS Duration:   QT Interval:    QTC Calculation:   R Axis:     Text Interpretation:         Labs Reviewed  CBC - Abnormal; Notable for the following components:      Result Value   WBC 10.6 (*)    All other components within normal limits  COMPREHENSIVE METABOLIC PANEL - Abnormal; Notable for the following components:   Chloride 97 (*)    Glucose, Bld 108 (*)    Creatinine, Ser 1.40 (*)    All other components within normal limits  CK - Abnormal; Notable for the following components:   Total CK 805 (*)    All other components within normal limits     No orders to display    Medications  acetaminophen (TYLENOL) tablet 1,000 mg (1,000 mg Oral Given 06/22/21 0233)     Procedures  /  Critical Care Procedures  ED Course and Medical Decision Making  I have reviewed the triage vital signs, the nursing notes, and pertinent available records from the EMR.  Listed above are laboratory and imaging tests that I personally ordered, reviewed, and interpreted and then considered in my medical decision making (see below for details).  Suspect muscle soreness, also considering rhabdomyolysis given patient's history of the same, awaiting labs.  No midline spinal tenderness, nothing to suggest myelopathy.     Labs reveal mild AKI, CK of 800.  Patient is feeling well.  Mother at bedside.  I discussed the diagnosis of mild rhabdomyolysis with mildly high.  They are comfortable being discharged home, continuing fluids, avoiding any exertional activities, repeating labs in 1 week.  Strict return precautions.  Elmer Sow. Pilar Plate, MD [facility] Emergency Medicine [facility] [email or web address]  Final Clinical Impressions(s) / ED Diagnoses     ICD-10-CM   1. Traumatic rhabdomyolysis, initial encounter (HCC)  [email or web address]       ED Discharge Orders     None        Discharge Instructions Discussed with and Provided to Patient:     Discharge Instructions      You were evaluated in the Emergency Department and after careful evaluation, we did not find any emergent condition requiring admission or further testing in the hospital.  Symptoms seem to be due to a mild case of rhabdomyolysis.  Recommend continued hydration at home, rest, avoiding any exertional activities or exercise, avoiding the heat, recommend repeat blood testing in 1 week.  If cannot find a primary care doctor during this time, you can return to the emergency department for repeat testing.  Please return to the Emergency Department if you experience any worsening of  your condition.  Thank you for allowing [place] to be a part of your care.         Sabas Sous, MD 06/22/21 [phone]

## 2021-06-22 NOTE — ED Notes (Signed)
Pt verbalizes understanding of discharge instructions. Opportunity for questioning and answers were provided. Armand removed by staff, pt discharged from ED to home. Educated to do repeat blood work and drink plenty of fluids.

## 2021-07-02 ENCOUNTER — Other Ambulatory Visit: Payer: Self-pay

## 2021-07-15 DIAGNOSIS — M25571 Pain in right ankle and joints of right foot: Secondary | ICD-10-CM | POA: Diagnosis not present

## 2021-07-15 DIAGNOSIS — M25471 Effusion, right ankle: Secondary | ICD-10-CM | POA: Diagnosis not present

## 2021-11-17 DIAGNOSIS — J1189 Influenza due to unidentified influenza virus with other manifestations: Secondary | ICD-10-CM | POA: Diagnosis not present

## 2021-12-10 NOTE — Progress Notes (Signed)
[name] [address] [place] [address] Phone: [phone] Subjective:   Gregory Stevens, am serving as a scribe for Dr. Antoine Primas. This visit occurred during the SARS-CoV-2 public health emergency.  Safety protocols were in place, including screening questions prior to the visit, additional usage of staff PPE, and extensive cleaning of exam room while observing appropriate contact time as indicated for disinfecting solutions.  I'm seeing this patient by the request  of:  Pcp, No  CC: Right ankle pain  [id number]  Gregory Stevens is a 21 y.o. male coming in with complaint of R ankle pan. Patient states that he broke his ankle September 2022. Came down on foot and felt like he had an ankle sprain. Was seen at [facility]. Wore boot for about 2 weeks. Does not feel like he has full ROM in ankle. Pain with jumping. Has not been wearing brace.       Past Medical History:  Diagnosis Date   Asthma    Environmental and seasonal allergies 04/10/2020   Exercise-induced asthma 04/10/2020   Lazy eye    Raynaud's disease 04/10/2020   Rhabdomyolysis 2021   Past Surgical History:  Procedure Laterality Date   WISDOM TOOTH EXTRACTION  2019   Social History   Socioeconomic History   Marital status: Single    Spouse name: Not on file   Number of children: Not on file   Years of education: Not on file   Highest education level: Not on file  Occupational History   Not on file  Tobacco Use   Smoking status: Never   Smokeless tobacco: Never  Substance and Sexual Activity   Alcohol use: Yes   Drug use: Never   Sexual activity: Not on file  Other Topics Concern   Not on file  Social History Narrative   ** Merged History Encounter **       Social Determinants of Health   Financial Resource Strain: Not on file  Food Insecurity: Not on file  Transportation Needs: Not on file  Physical Activity: Not on file  Stress: Not on file  Social  Connections: Not on file   Allergies  Allergen Reactions   Kiwi Extract Swelling    Tongue and throat swell   Molds & Smuts Rash   Family History  Problem Relation Age of Onset   Hypertension Mother    Asthma Mother    Lichen planus Mother    Asthma Sister    Hypertension Maternal Grandmother    Non-Hodgkin's lymphoma Maternal Grandmother    Hypertension Maternal Grandfather    Hypercholesterolemia Maternal Grandfather    Prostate cancer Maternal Grandfather    Peripheral Artery Disease Maternal Grandfather    Hypertension Paternal Grandmother    Lung disease Paternal Grandmother    Lung cancer Paternal Grandfather       Current Outpatient Medications (Respiratory):    albuterol (PROVENTIL HFA;VENTOLIN HFA) 108 (90 BASE) MCG/ACT inhaler, Inhale 2 puffs into the lungs daily as needed for wheezing or shortness of breath.    loratadine (CLARITIN) 10 MG tablet, Take 10 mg by mouth daily.      Reviewed prior external information including notes and imaging from  primary care provider As well as notes that were available from care everywhere and other healthcare systems.  Past medical history, social, surgical and family history all reviewed in electronic medical record.  No pertanent information unless stated regarding to the chief complaint.   Review of  Systems:  No headache, visual changes, nausea, vomiting, diarrhea, constipation, dizziness, abdominal pain, skin rash, fevers, chills, night sweats, weight loss, swollen lymph nodes, body aches, joint swelling, chest pain, shortness of breath, mood changes. POSITIVE muscle aches  Objective  Blood pressure 120/84, pulse 91, height 5\' 6"  (1.676 m), weight 135 lb (61.2 kg), SpO2 98 %.   General: No apparent distress alert and oriented x3 mood and affect normal, dressed appropriately.  HEENT: Pupils equal, extraocular movements intact  Respiratory: Patient's speak in full sentences and does not appear short of breath   Cardiovascular: No lower extremity edema, non tender, no erythema  Gait normal with good balance and coordination.  Right ankle exam does have very mild swelling changes on the lateral aspect lateral malleolus.  Seems to be more and just posterior to the area.  Some tenderness over the peroneal tendon.  No audible popping no noted.  No instability with drawer test.  Limited muscular skeletal ultrasound was performed and interpreted by , M  Limited ultrasound of patient's right ankle shows the patient has callus formation over the distal tip of the lateral malleolus.  Patient also has some mild hypoechoic changes noted does seem to be chronic in the peroneal tendon sheath.  No hypoechoic changes within the ankle mortise. Impression: Healed lateral malleolus injury which is what appears to be may be some chronic peroneal tendinitis.      Impression and Recommendations:     The above documentation has been reviewed and is accurate and complete Antoine Primas, DO

## 2021-12-11 ENCOUNTER — Other Ambulatory Visit: Payer: Self-pay

## 2021-12-11 ENCOUNTER — Ambulatory Visit (INDEPENDENT_AMBULATORY_CARE_PROVIDER_SITE_OTHER): Payer: 59 | Admitting: Family Medicine

## 2021-12-11 ENCOUNTER — Encounter: Payer: Self-pay | Admitting: Family Medicine

## 2021-12-11 ENCOUNTER — Ambulatory Visit: Payer: Self-pay

## 2021-12-11 ENCOUNTER — Ambulatory Visit (INDEPENDENT_AMBULATORY_CARE_PROVIDER_SITE_OTHER): Payer: 59

## 2021-12-11 VITALS — BP 120/84 | HR 91 | Ht 66.0 in | Wt 135.0 lb

## 2021-12-11 DIAGNOSIS — M25571 Pain in right ankle and joints of right foot: Secondary | ICD-10-CM | POA: Diagnosis not present

## 2021-12-11 DIAGNOSIS — G8929 Other chronic pain: Secondary | ICD-10-CM | POA: Diagnosis not present

## 2021-12-11 DIAGNOSIS — M7671 Peroneal tendinitis, right leg: Secondary | ICD-10-CM | POA: Diagnosis not present

## 2021-12-11 NOTE — Patient Instructions (Signed)
Exercises Small heel lift in shoe Lace up ankle brace when playing sports Ice 20 min 2x a day Xray on way out See me again in 6 weeks

## 2021-12-11 NOTE — Assessment & Plan Note (Addendum)
I believe the patient's pain is more secondary to the peroneal tendinitis injury in the ankle injury to the lateral malleolus at this point.  We discussed home exercises, possible heel lift, and bracing when patient is doing sports at the moment.  Patient will try to get that over-the-counter.  Topical anti-inflammatories.  We will get x-rays to further evaluate and quick read does show that patient does have some calcific changes noted of the posterior lateral mortise.  Does have an abnormality of the lateral malleolus but appears to be chronic as well.  And follow-up with me again in 6 weeks to further evaluate and make sure patient is improving.

## 2022-01-21 NOTE — Progress Notes (Signed)
?Terrilee Files D.O. ?[facility] ?[address] [place] [address] ?Phone: [phone] ?Subjective:   ?I, Gregory Stevens, am serving as a scribe for Dr. Antoine Primas. ? ?This visit occurred during the SARS-CoV-2 public health emergency.  Safety protocols were in place, including screening questions prior to the visit, additional usage of staff PPE, and extensive cleaning of exam room while observing appropriate contact time as indicated for disinfecting solutions.  ? ? ?I'm seeing this patient by the request  of:  Pcp, No ? ?CC: Ankle pain follow-up ? ?[id number]  ?12/11/2021 ?I believe the patient's pain is more secondary to the peroneal tendinitis injury in the ankle injury to the lateral malleolus at this point.  We discussed home exercises, possible heel lift, and bracing when patient is doing sports at the moment.  Patient will try to get that over-the-counter.  Topical anti-inflammatories.  We will get x-rays to further evaluate and quick read does show that patient does have some calcific changes noted of the posterior lateral mortise.  Does have an abnormality of the lateral malleolus but appears to be chronic as well.  And follow-up with me again in 6 weeks to further evaluate and make sure patient is improving. ? ?Updated 01/22/2022 ?Gregory Stevens is a 21 y.o. male coming in with complaint of ankle pain. Patient has been able to playing basketball without pain. No residual pain other than soreness. Stretching and wearing ankle brace. Would like to get back to training for football but is hesitant. Would like to try to play at [facility] [employer].  ? ?Xray IMPRESSION: ?No malalignment or acute fracture. ? ?  ? ?Past Medical History:  ?Diagnosis Date  ? Asthma   ? Environmental and seasonal allergies 04/10/2020  ? Exercise-induced asthma 04/10/2020  ? Lazy eye   ? Raynaud's disease 04/10/2020  ? Rhabdomyolysis 2021  ? ?Past Surgical History:  ?Procedure Laterality Date  ? WISDOM TOOTH EXTRACTION   2019  ? ?Social History  ? ?Socioeconomic History  ? Marital status: Single  ?  Spouse name: Not on file  ? Number of children: Not on file  ? Years of education: Not on file  ? Highest education level: Not on file  ?Occupational History  ? Not on file  ?Tobacco Use  ? Smoking status: Never  ? Smokeless tobacco: Never  ?Substance and Sexual Activity  ? Alcohol use: Yes  ? Drug use: Never  ? Sexual activity: Not on file  ?Other Topics Concern  ? Not on file  ?Social History Narrative  ? ** Merged History Encounter **  ?    ? ?Social Determinants of Health  ? ?Financial Resource Strain: Not on file  ?Food Insecurity: Not on file  ?Transportation Needs: Not on file  ?Physical Activity: Not on file  ?Stress: Not on file  ?Social Connections: Not on file  ? ?Allergies  ?Allergen Reactions  ? Kiwi Extract Swelling  ?  Tongue and throat swell  ? Molds & Smuts Rash  ? ?Family History  ?Problem Relation Age of Onset  ? Hypertension Mother   ? Asthma Mother   ? Lichen planus Mother   ? Asthma Sister   ? Hypertension Maternal Grandmother   ? Non-Hodgkin's lymphoma Maternal Grandmother   ? Hypertension Maternal Grandfather   ? Hypercholesterolemia Maternal Grandfather   ? Prostate cancer Maternal Grandfather   ? Peripheral Artery Disease Maternal Grandfather   ? Hypertension Paternal Grandmother   ? Lung disease Paternal Grandmother   ?  Lung cancer Paternal Grandfather   ? ? ? ? ?Current Outpatient Medications (Respiratory):  ?  albuterol (PROVENTIL HFA;VENTOLIN HFA) 108 (90 BASE) MCG/ACT inhaler, Inhale 2 puffs into the lungs daily as needed for wheezing or shortness of breath.  ?  loratadine (CLARITIN) 10 MG tablet, Take 10 mg by mouth daily. ? ? ? ? ? ?Reviewed prior external information including notes and imaging from  ?primary care provider ?As well as notes that were available from care everywhere and other healthcare systems. ? ?Past medical history, social, surgical and family history all reviewed in electronic medical  record.  No pertanent information unless stated regarding to the chief complaint.  ? ?Review of Systems: ? No headache, visual changes, nausea, vomiting, diarrhea, constipation, dizziness, abdominal pain, skin rash, fevers, chills, night sweats, weight loss, swollen lymph nodes, body aches, joint swelling, chest pain, shortness of breath, mood changes. POSITIVE muscle aches ? ?Objective  ?Blood pressure 120/82, pulse 80, height 5\' 6"  (1.676 m), weight 136 lb (61.7 kg), SpO2 99 %. ?  ?General: No apparent distress alert and oriented x3 mood and affect normal, dressed appropriately.  ?HEENT: Pupils equal, extraocular movements intact  ?Respiratory: Patient's speak in full sentences and does not appear short of breath  ?Cardiovascular: No lower extremity edema, non tender, no erythema  ?Gait normal with good balance and coordination.  ?MSK: Right ankle exam has been nontender on exam.  5 out of 5 strength noted.  Neurovascularly intact. ? ?Limited muscular skeletal ultrasound was performed and interpreted by , M  ?Limited ultrasound of patient's ankle does not show any significant hypoechoic changes that was seen previously.  Peroneal tendons appear to be unremarkable. ?Impression: Normal ankle ?  ?Impression and Recommendations:  ?  ? ?The above documentation has been reviewed and is accurate and complete Antoine Primas, DO ? ? ?

## 2022-01-22 ENCOUNTER — Other Ambulatory Visit: Payer: Self-pay

## 2022-01-22 ENCOUNTER — Ambulatory Visit (INDEPENDENT_AMBULATORY_CARE_PROVIDER_SITE_OTHER): Payer: 59 | Admitting: Family Medicine

## 2022-01-22 ENCOUNTER — Ambulatory Visit: Payer: Self-pay

## 2022-01-22 ENCOUNTER — Encounter: Payer: Self-pay | Admitting: Family Medicine

## 2022-01-22 VITALS — BP 120/82 | HR 80 | Ht 66.0 in | Wt 136.0 lb

## 2022-01-22 DIAGNOSIS — M7671 Peroneal tendinitis, right leg: Secondary | ICD-10-CM

## 2022-01-22 DIAGNOSIS — M25571 Pain in right ankle and joints of right foot: Secondary | ICD-10-CM

## 2022-01-22 NOTE — Assessment & Plan Note (Signed)
Patient seems to be doing better at this point.  Discussed icing regimen and home exercises.  I do not see anything that should keep him from falling.  Did discuss potentially a lace up brace.  Follow-up with me again in 6 to 8 weeks if needed otherwise as needed ?

## 2022-01-22 NOTE — Patient Instructions (Signed)
AGGIE PRIDE ?Wear lace up ankle brace while playing ?Ice after activity ?If any set backs give [place] a call ?See [place] when you need [place] ?

## 2022-04-05 ENCOUNTER — Other Ambulatory Visit: Payer: Self-pay

## 2022-04-05 ENCOUNTER — Encounter (HOSPITAL_BASED_OUTPATIENT_CLINIC_OR_DEPARTMENT_OTHER): Payer: Self-pay | Admitting: *Deleted

## 2022-04-05 ENCOUNTER — Emergency Department (HOSPITAL_BASED_OUTPATIENT_CLINIC_OR_DEPARTMENT_OTHER): Payer: 59 | Admitting: Radiology

## 2022-04-05 ENCOUNTER — Emergency Department (HOSPITAL_BASED_OUTPATIENT_CLINIC_OR_DEPARTMENT_OTHER)
Admission: EM | Admit: 2022-04-05 | Discharge: 2022-04-05 | Disposition: A | Discharge status: Home / Self Care | Payer: 59 | Attending: Emergency Medicine | Admitting: Emergency Medicine

## 2022-04-05 DIAGNOSIS — R1013 Epigastric pain: Secondary | ICD-10-CM | POA: Diagnosis not present

## 2022-04-05 DIAGNOSIS — Z7951 Long term (current) use of inhaled steroids: Secondary | ICD-10-CM | POA: Diagnosis not present

## 2022-04-05 DIAGNOSIS — R079 Chest pain, unspecified: Secondary | ICD-10-CM | POA: Diagnosis not present

## 2022-04-05 DIAGNOSIS — J45909 Unspecified asthma, uncomplicated: Secondary | ICD-10-CM | POA: Diagnosis not present

## 2022-04-05 DIAGNOSIS — R0789 Other chest pain: Secondary | ICD-10-CM | POA: Diagnosis not present

## 2022-04-05 DIAGNOSIS — R112 Nausea with vomiting, unspecified: Secondary | ICD-10-CM | POA: Diagnosis not present

## 2022-04-05 LAB — CBC WITH DIFFERENTIAL/PLATELET
Abs Immature Granulocytes: 0.01 10*3/uL (ref 0.00–0.07)
Basophils Absolute: 0 10*3/uL (ref 0.0–0.1)
Basophils Relative: 0 %
Eosinophils Absolute: 0.1 10*3/uL (ref 0.0–0.5)
Eosinophils Relative: 2 %
HCT: 43.9 % (ref 39.0–52.0)
Hemoglobin: 14 g/dL (ref 13.0–17.0)
Immature Granulocytes: 0 %
Lymphocytes Relative: 31 %
Lymphs Abs: 1.9 10*3/uL (ref 0.7–4.0)
MCH: 27 pg (ref 26.0–34.0)
MCHC: 31.9 g/dL (ref 30.0–36.0)
MCV: 84.6 fL (ref 80.0–100.0)
Monocytes Absolute: 0.5 10*3/uL (ref 0.1–1.0)
Monocytes Relative: 9 %
Neutro Abs: 3.7 10*3/uL (ref 1.7–7.7)
Neutrophils Relative %: 58 %
Platelets: 201 10*3/uL (ref 150–400)
RBC: 5.19 MIL/uL (ref 4.22–5.81)
RDW: 13.1 % (ref 11.5–15.5)
WBC: 6.3 10*3/uL (ref 4.0–10.5)
nRBC: 0 % (ref 0.0–0.2)

## 2022-04-05 LAB — COMPREHENSIVE METABOLIC PANEL
ALT: 17 U/L (ref 0–44)
AST: 17 U/L (ref 15–41)
Albumin: 4.9 g/dL (ref 3.5–5.0)
Alkaline Phosphatase: 35 U/L — ABNORMAL LOW (ref 38–126)
Anion gap: 10 (ref 5–15)
BUN: 18 mg/dL (ref 6–20)
CO2: 29 mmol/L (ref 22–32)
Calcium: 10 mg/dL (ref 8.9–10.3)
Chloride: 101 mmol/L (ref 98–111)
Creatinine, Ser: 1.06 mg/dL (ref 0.61–1.24)
GFR, Estimated: >60 mL/min (ref >60)
Glucose, Bld: 90 mg/dL (ref 70–99)
Potassium: 3.9 mmol/L (ref 3.5–5.1)
Sodium: 140 mmol/L (ref 135–145)
Total Bilirubin: 1 mg/dL (ref 0.3–1.2)
Total Protein: 7.9 g/dL (ref 6.5–8.1)

## 2022-04-05 LAB — CK: Total CK: 294 U/L (ref 49–397)

## 2022-04-05 LAB — PROTIME-INR
INR: 1 (ref 0.8–1.2)
Prothrombin Time: 13.3 seconds (ref 11.4–15.2)

## 2022-04-05 LAB — LACTIC ACID, PLASMA
Lactic Acid, Venous: 1 mmol/L (ref 0.5–1.9)
Lactic Acid, Venous: 0.9 mmol/L (ref 0.5–1.9)

## 2022-04-05 LAB — TROPONIN I (HIGH SENSITIVITY)
Troponin I (High Sensitivity): 2 ng/L (ref <18)
Troponin I (High Sensitivity): 3 ng/L (ref <18)

## 2022-04-05 LAB — LIPASE, BLOOD: Lipase: 21 U/L (ref 11–51)

## 2022-04-05 LAB — D-DIMER, QUANTITATIVE: D-Dimer, Quant: <0.27 ug/mL-FEU (ref 0.00–0.50)

## 2022-04-05 MED ORDER — ALUM & MAG HYDROXIDE-SIMETH 200-200-20 MG/5ML PO SUSP
30.0000 mL | Freq: Once | ORAL | Status: AC
  Administered 2022-04-05: 30 mL via ORAL
  Filled 2022-04-05: qty 30

## 2022-04-05 MED ORDER — SUCRALFATE 1 GM/10ML PO SUSP
1.0000 g | Freq: Once | ORAL | Status: AC
  Administered 2022-04-05: 1 g via ORAL
  Filled 2022-04-05: qty 10

## 2022-04-05 MED ORDER — SUCRALFATE 1 G PO TABS: 1.0000 g | ORAL_TABLET | Freq: Three times a day (TID) | ORAL | 0 refills | Status: AC

## 2022-04-05 MED ORDER — OMEPRAZOLE 20 MG PO CPDR: 20.0000 mg | DELAYED_RELEASE_CAPSULE | Freq: Every day | ORAL | 0 refills | Status: AC

## 2022-04-05 MED ORDER — LIDOCAINE VISCOUS HCL 2 % MT SOLN
15.0000 mL | Freq: Once | OROMUCOSAL | Status: AC
  Administered 2022-04-05: 15 mL via ORAL
  Filled 2022-04-05: qty 15

## 2022-04-05 NOTE — Discharge Instructions (Signed)
Your history, exam, work-up today were overall reassuring.  Your labs were reassuring as we discussed.  There is no evidence of anemia.  I suspect that based on your symptom location with discomfort in the vomiting with some intermittent bleeding you have irritation to your esophagus or stomach lining.  Please take the Prilosec and Carafate to help with this and follow-up with a GI doctor.  I gave you both local groups so please call to follow-up.  Please rest and stay hydrated.  If any symptoms change or worsen acutely, please return to the nearest Emergency Department.Marland Kitchen

## 2022-04-05 NOTE — ED Triage Notes (Signed)
Pt c/o of chest pain times one month that comes and goes. States pain is sharp and with pain he feels like it is hard to take a breath. States today he has one episode of vomiting. States this was bloody. C/o nausea. Denies any diarrhea or fevers. C/o abd pain that comes and goes as well.

## 2022-04-05 NOTE — ED Provider Notes (Signed)
[facility] EMERGENCY DEPT Provider Note   CSN: [id number] Arrival date & time: 04/05/22  0602     History  Chief Complaint  Patient presents with   Emesis    Gregory Stevens is a 21 y.o. male.  The history is provided by the patient, a parent and medical records. No language interpreter was used.  Emesis Severity:  Moderate Duration:  1 month Timing:  Sporadic Quality:  Stomach contents and bright red blood Progression:  Unchanged Chronicity:  Recurrent Recent urination:  Normal Relieved by:  Nothing Worsened by:  Nothing Ineffective treatments:  None tried Associated symptoms: abdominal pain (mild)   Associated symptoms: no chills, no cough, no diarrhea, no fever, no headaches and no URI       Home Medications Prior to Admission medications   Medication Sig Start Date End Date Taking? Authorizing Provider  albuterol (PROVENTIL HFA;VENTOLIN HFA) 108 (90 BASE) MCG/ACT inhaler Inhale 2 puffs into the lungs daily as needed for wheezing or shortness of breath.     [provider]  loratadine (CLARITIN) 10 MG tablet Take 10 mg by mouth daily.    [provider]      Allergies    Kiwi extract and Molds & smuts    Review of Systems   Review of Systems  Constitutional:  Negative for chills, diaphoresis, fatigue and fever.  HENT:  Negative for congestion.   Respiratory:  Negative for cough, chest tightness, shortness of breath and wheezing.   Cardiovascular:  Positive for chest pain. Negative for palpitations.  Gastrointestinal:  Positive for abdominal pain (mild), nausea and vomiting. Negative for abdominal distention, constipation and diarrhea.  Genitourinary:  Negative for flank pain.  Musculoskeletal:  Negative for back pain, neck pain and neck stiffness.  Skin:  Negative for rash and wound.  Neurological:  Negative for dizziness, weakness, light-headedness, numbness and headaches.  Psychiatric/Behavioral:  Negative for agitation  and confusion.   All other systems reviewed and are negative.  Physical Exam Updated Vital Signs BP (!) 131/91 (BP Location: Right Arm)   Pulse 67   Temp 98.2 F (36.8 C) (Oral)   Resp 20   Ht 5\' 5"  (1.651 m)   Wt 64.4 kg   SpO2 100%   BMI 23.63 kg/m  Physical Exam Vitals and nursing note reviewed.  Constitutional:      General: He is not in acute distress.    Appearance: He is well-developed. He is not ill-appearing, toxic-appearing or diaphoretic.  HENT:     Head: Normocephalic and atraumatic.     Mouth/Throat:     Mouth: Mucous membranes are moist.     Pharynx: No oropharyngeal exudate or posterior oropharyngeal erythema.  Eyes:     Extraocular Movements: Extraocular movements intact.     Conjunctiva/sclera: Conjunctivae normal.     Pupils: Pupils are equal, round, and reactive to light.  Neck:     Vascular: No carotid bruit.  Cardiovascular:     Rate and Rhythm: Normal rate and regular rhythm.     Heart sounds: No murmur heard. Pulmonary:     Effort: Pulmonary effort is normal. No respiratory distress.     Breath sounds: Normal breath sounds. No wheezing, rhonchi or rales.  Chest:     Chest wall: No tenderness.  Abdominal:     General: Abdomen is flat.     Palpations: Abdomen is soft.     Tenderness: There is no abdominal tenderness. There is no right CVA tenderness, left CVA tenderness,  guarding or rebound.  Musculoskeletal:        General: No swelling, tenderness or signs of injury.     Cervical back: Neck supple. No tenderness.     Right lower leg: No edema.     Left lower leg: No edema.  Skin:    General: Skin is warm and dry.     Capillary Refill: Capillary refill takes less than 2 seconds.     Findings: No erythema or rash.  Neurological:     General: No focal deficit present.     Mental Status: He is alert.     Sensory: No sensory deficit.     Motor: No weakness.  Psychiatric:        Mood and Affect: Mood normal.    ED Results / Procedures /  Treatments   Labs (all labs ordered are listed, but only abnormal results are displayed) Labs Reviewed  COMPREHENSIVE METABOLIC PANEL - Abnormal; Notable for the following components:      Result Value   Alkaline Phosphatase 35 (*)    All other components within normal limits  CK  CBC WITH DIFFERENTIAL/PLATELET  LIPASE, BLOOD  LACTIC ACID, PLASMA  LACTIC ACID, PLASMA  D-DIMER, QUANTITATIVE  PROTIME-INR  TROPONIN I (HIGH SENSITIVITY)  TROPONIN I (HIGH SENSITIVITY)    EKG EKG Interpretation  Date/Time:  Sunday Apr 05 2022 06:31:01 EDT Ventricular Rate:  78 PR Interval:  170 QRS Duration: 84 QT Interval:  358 QTC Calculation: 408 R Axis:   80 Text Interpretation: Normal sinus rhythm ST elevation, consider early repolarization, pericarditis, or injury Abnormal ECG No previous ECGs available no prior ECG for comparison. no STEMI Confirmed by Antony Blackbird [phone]) on 04/05/2022 7:34:03 AM  Radiology DG Chest 2 View  Result Date: 04/05/2022 CLINICAL DATA:  21 year old male with history of chest and epigastric pain. Hemoptysis versus hematemesis. EXAM: CHEST - 2 VIEW COMPARISON:  Chest x-ray 03/19/2012. FINDINGS: Lung volumes are normal. No consolidative airspace disease. No pleural effusions. No pneumothorax. No pulmonary nodule or mass noted. Pulmonary vasculature and the cardiomediastinal silhouette are within normal limits. IMPRESSION: No radiographic evidence of acute cardiopulmonary disease. Electronically Signed   By: Vinnie Langton M.D.   On: 04/05/2022 09:08    Procedures Procedures    Medications Ordered in ED Medications  alum & mag hydroxide-simeth (MAALOX/MYLANTA) 200-200-20 MG/5ML suspension 30 mL (30 mLs Oral Given 04/05/22 0837)    And  lidocaine (XYLOCAINE) 2 % viscous mouth solution 15 mL (15 mLs Oral Given 04/05/22 0837)  sucralfate (CARAFATE) 1 GM/10ML suspension 1 g (1 g Oral Given 04/05/22 [id number])    ED Course/ Medical Decision Making/ A&P                            Medical Decision Making Amount and/or Complexity of Data Reviewed Labs: ordered. Radiology: ordered.  Risk OTC drugs. Prescription drug management.    Gregory Stevens is a 21 y.o. male with a past medical history significant for exercise-induced asthma, previous rhabdomyolysis requiring admission, and Raynaud's who presents with 1 month of chest pain and upper abdominal discomfort, intermittent shortness of breath, and new hemoptysis/hematemesis.  According to patient, for the last month or so he is having pain in his chest and upper abdomen near the epigastric and left upper quadrant area.  He reports he has no history of reflux or ulcers but describes this pain as aching.  It comes and goes.  He is unsure  what causes it.  He did not want to tell anyone but finally told his mother who told him to come in.  He says that he has had no fevers, chills, Jassen, or cough.  He is describing discomfort in his central chest and otherwise does not radiate side from the abdomen.  Does not go to his back or shoulders.  Denies any vomiting but reports occasional nausea.  Denies diaphoresis or lightheadedness or syncope.  He is unsure if he was coughing blood or vomiting blood but thinks it was vomiting.  Denies any constipation, diarrhea, or bloody stools.  Denies any urinary changes.  Denies leg pain or leg swelling and denies history of DVT or PE.  Reports the pain is currently very mild but at its worst was a 4-5 out of 10.  His family is currently undergoing work-up at [facility] for an undiagnosed bleeding disorder.  EKG shows no STEMI but does show some early repole likely.  On exam, lungs clear and chest nontender.  Abdomen is completely nontender.  No murmur.  Normal bowel sounds.  Legs nontender nonedematous.  Back and flanks nontender.  No rashes to suggest shingles.  Oropharyngeal exam unremarkable.  Patient well-appearing.  Good pulses in extremities.  Patient resting with normal vital  signs.  Had a shared decision made conversation with patient and family.  Given his lack of current abdominal pain or any abdominal tenderness on exam with good pulses distally, we agreed to hold on CT the abdomen pelvis.  We did however agreed to get labs, chest x-ray, and will monitor him.  I clinically suspect he has either a gastritis or esophagitis causing some of the bleeding and pain especially if there is a undiagnosed bleeding history in the family.  We agreed to do a GI cocktail to try to alleviate any of the discomfort he may still have and get the work-up going.  We agreed to do a PE study if the dimer is positive.  Patient will be worked up for possible life-threatening abnormality with chest pain.  If work-up is reassuring, suspect he is stable for discharge and outpatient GI follow-up.  Anticipate reassessment after work-up.  Work-up returned overall reassuring.  Troponin negative x2.  CK is normal now.  D-dimer is undetectable, doubt pulmonary embolism.  Lactic acid normal.  CBC and metabolic panel reassuring.  X-ray reassuring.  EKG reassuring.  Patient proved stability for over 8 hours without any further symptoms and is feeling much better.  Clinically I suspect his symptoms are related to either esophagitis or gastritis causing some intermittent mild bleeding.  We discussed taking Prilosec and Carafate and following with an outpatient GI team and he agrees.  We still agreed to hold on further imaging such as CTs given his reassuring exam and well appearance.  Family and patient agree and patient be discharged for outpatient follow-up.        Final Clinical Impression(s) / ED Diagnoses Final diagnoses:  Epigastric pain  Atypical chest pain  Nausea and vomiting, unspecified vomiting type    Rx / DC Orders ED Discharge Orders          Ordered    omeprazole (PRILOSEC) 20 MG capsule  Daily        04/05/22 1408    sucralfate (CARAFATE) 1 g tablet  3 times daily with  meals & bedtime        04/05/22 1408            Clinical Impression: 1.  Epigastric pain   2. Atypical chest pain   3. Nausea and vomiting, unspecified vomiting type     Disposition: Discharge  Condition: Good  I have discussed the results, Dx and Tx plan with the pt(& family if present). He/she/they expressed understanding and agree(s) with the plan. Discharge instructions discussed at great length. Strict return precautions discussed and pt &/or family have verbalized understanding of the instructions. No further questions at time of discharge.    New Prescriptions   OMEPRAZOLE (PRILOSEC) 20 MG CAPSULE    Take 1 capsule (20 mg total) by mouth daily.   SUCRALFATE (CARAFATE) 1 G TABLET    Take 1 tablet (1 g total) by mouth 4 (four) times daily -  with meals and at bedtime.    Follow Up: [facility] [place] [phone]    Gastroenterology, [address] [place] [address] [phone]         Joella Saefong, Gwenyth Allegra, MD 04/05/22 1409

## 2022-04-05 NOTE — ED Notes (Signed)
Pt ambulated to bathroom 

## 2022-04-15 DIAGNOSIS — K920 Hematemesis: Secondary | ICD-10-CM | POA: Diagnosis not present

## 2022-04-15 DIAGNOSIS — R634 Abnormal weight loss: Secondary | ICD-10-CM | POA: Diagnosis not present

## 2022-04-15 DIAGNOSIS — R1013 Epigastric pain: Secondary | ICD-10-CM | POA: Diagnosis not present

## 2022-06-03 IMAGING — DX DG CHEST 2V
2 series · 2 of 2 positions shown · non-contrast
Comparison: Chest x-ray 03/19/2012.

CLINICAL DATA: 20-year-old male with history of chest and
epigastric pain. Hemoptysis versus hematemesis.

EXAM:
CHEST - 2 VIEW

[chest pa]
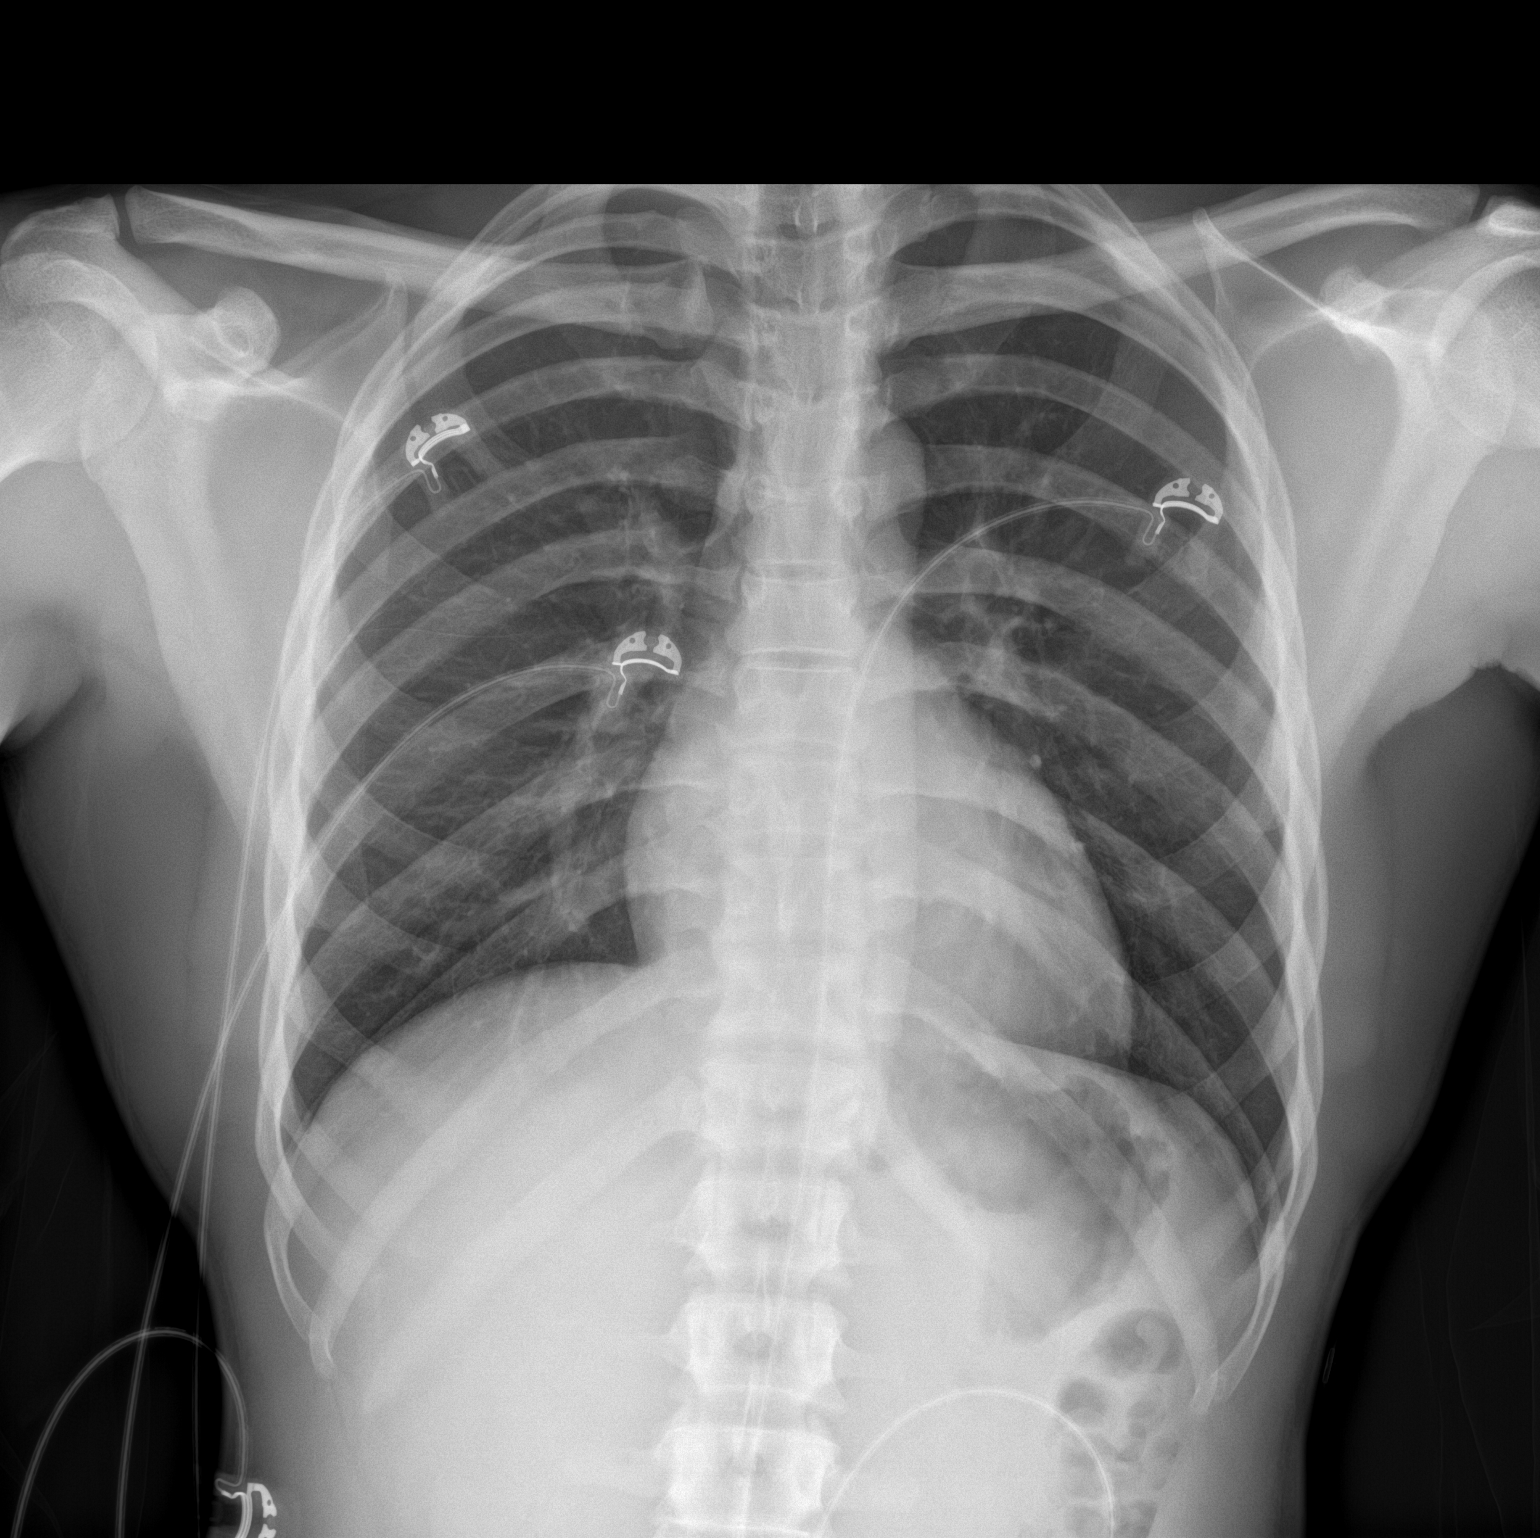

[chest lat]
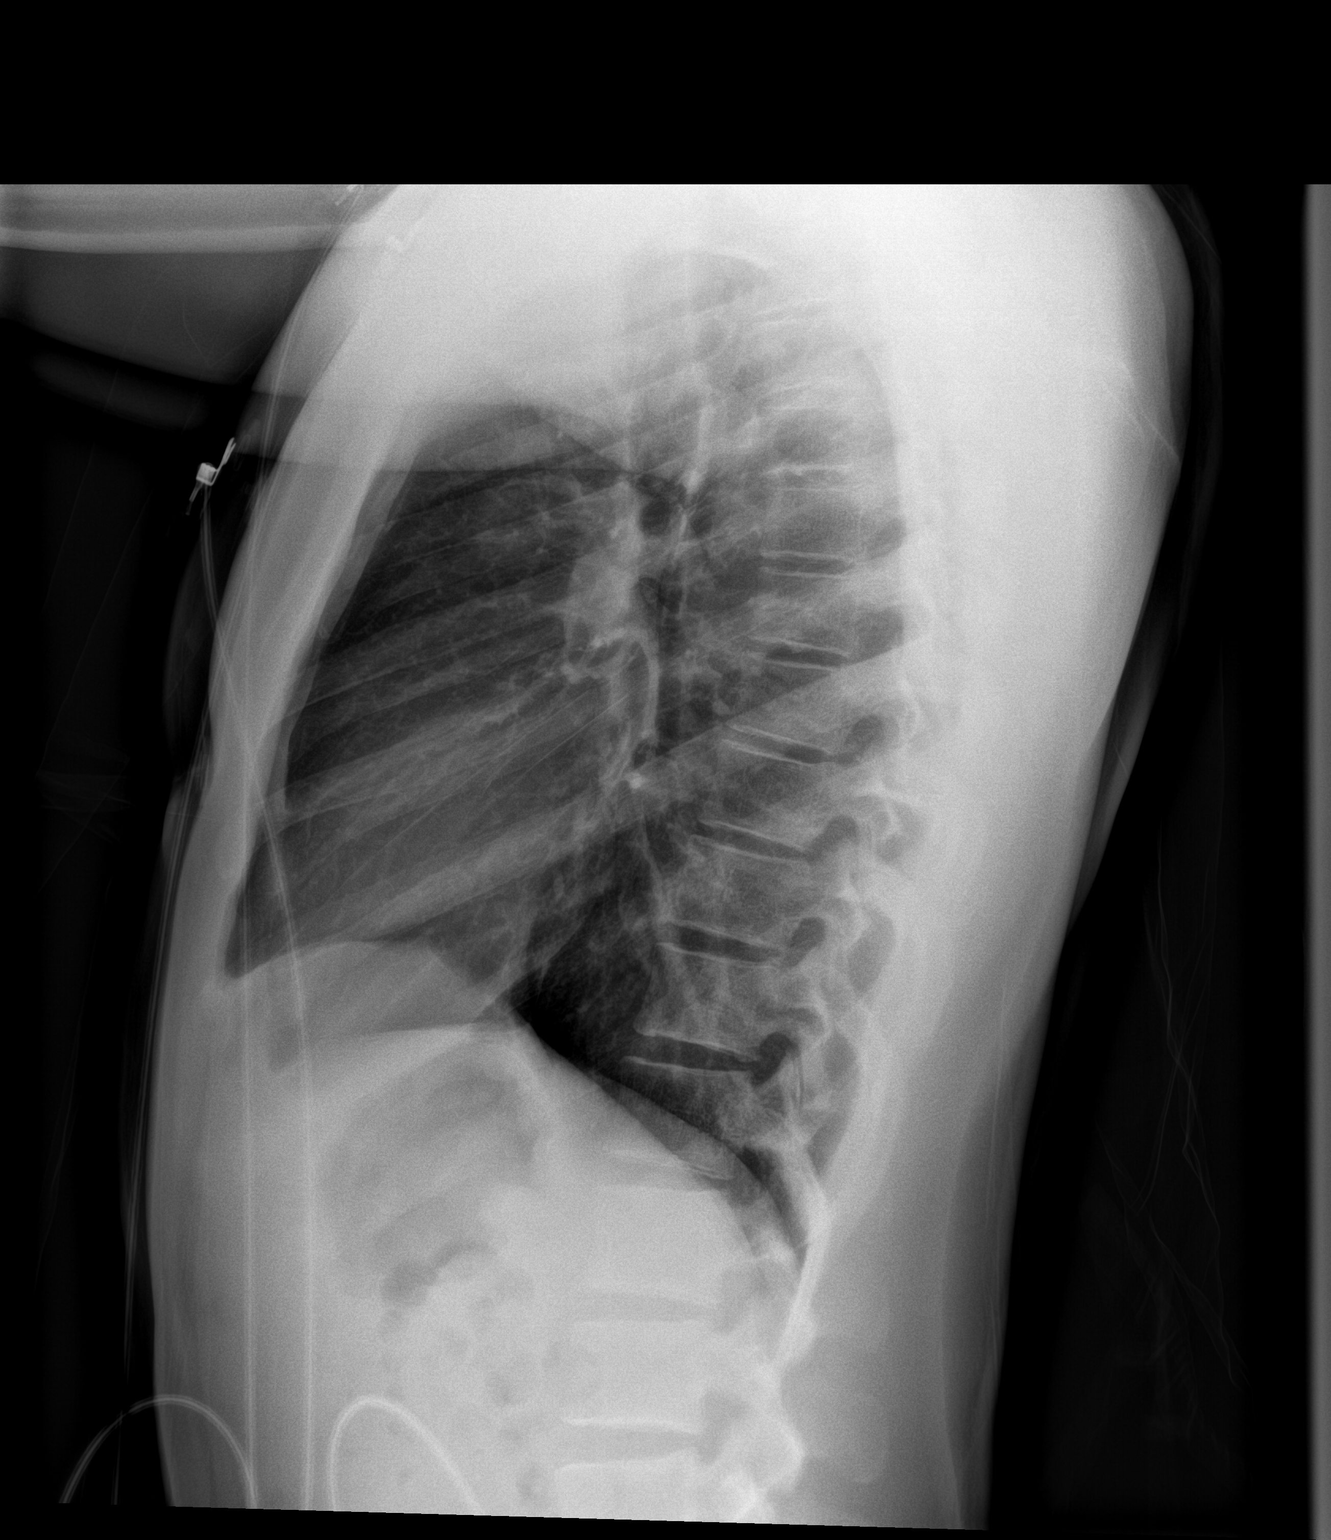

[2 of 2 positions shown; findings below may reference images not displayed]

FINDINGS: Lung volumes are normal. No consolidative airspace disease. No
pleural effusions. No pneumothorax. No pulmonary nodule or mass
noted. Pulmonary vasculature and the cardiomediastinal silhouette
are within normal limits.
IMPRESSION: No radiographic evidence of acute cardiopulmonary disease.

## 2022-10-09 ENCOUNTER — Encounter (HOSPITAL_BASED_OUTPATIENT_CLINIC_OR_DEPARTMENT_OTHER): Payer: Self-pay | Admitting: Emergency Medicine

## 2022-10-09 ENCOUNTER — Other Ambulatory Visit: Payer: Self-pay

## 2022-10-09 ENCOUNTER — Emergency Department (HOSPITAL_BASED_OUTPATIENT_CLINIC_OR_DEPARTMENT_OTHER)
Admission: EM | Admit: 2022-10-09 | Discharge: 2022-10-09 | Discharge status: Against Medical Advice | Payer: No Typology Code available for payment source | Attending: Emergency Medicine | Admitting: Emergency Medicine

## 2022-10-09 DIAGNOSIS — R112 Nausea with vomiting, unspecified: Secondary | ICD-10-CM | POA: Insufficient documentation

## 2022-10-09 DIAGNOSIS — Z5321 Procedure and treatment not carried out due to patient leaving prior to being seen by health care provider: Secondary | ICD-10-CM | POA: Insufficient documentation

## 2022-10-09 DIAGNOSIS — R197 Diarrhea, unspecified: Secondary | ICD-10-CM | POA: Insufficient documentation

## 2022-10-09 LAB — COMPREHENSIVE METABOLIC PANEL
ALT: 10 U/L (ref 0–44)
AST: 16 U/L (ref 15–41)
Albumin: 5.5 g/dL — ABNORMAL HIGH (ref 3.5–5.0)
Alkaline Phosphatase: 42 U/L (ref 38–126)
Anion gap: 12 (ref 5–15)
BUN: 20 mg/dL (ref 6–20)
CO2: 25 mmol/L (ref 22–32)
Calcium: 10 mg/dL (ref 8.9–10.3)
Chloride: 102 mmol/L (ref 98–111)
Creatinine, Ser: 1.03 mg/dL (ref 0.61–1.24)
GFR, Estimated: >60 mL/min (ref >60)
Glucose, Bld: 96 mg/dL (ref 70–99)
Potassium: 3.7 mmol/L (ref 3.5–5.1)
Sodium: 139 mmol/L (ref 135–145)
Total Bilirubin: 0.8 mg/dL (ref 0.3–1.2)
Total Protein: 8.6 g/dL — ABNORMAL HIGH (ref 6.5–8.1)

## 2022-10-09 LAB — URINALYSIS, ROUTINE W REFLEX MICROSCOPIC
Bilirubin Urine: NEGATIVE
Glucose, UA: NEGATIVE mg/dL
Hgb urine dipstick: NEGATIVE
Ketones, ur: NEGATIVE mg/dL
Leukocytes,Ua: NEGATIVE
Nitrite: NEGATIVE
Specific Gravity, Urine: 1.037 — ABNORMAL HIGH (ref 1.005–1.030)
pH: 6 (ref 5.0–8.0)

## 2022-10-09 LAB — CBC
HCT: 45.5 % (ref 39.0–52.0)
Hemoglobin: 14.6 g/dL (ref 13.0–17.0)
MCH: 27.3 pg (ref 26.0–34.0)
MCHC: 32.1 g/dL (ref 30.0–36.0)
MCV: 85 fL (ref 80.0–100.0)
Platelets: 210 10*3/uL (ref 150–400)
RBC: 5.35 MIL/uL (ref 4.22–5.81)
RDW: 12.6 % (ref 11.5–15.5)
WBC: 10.2 10*3/uL (ref 4.0–10.5)
nRBC: 0 % (ref 0.0–0.2)

## 2022-10-09 LAB — LIPASE, BLOOD: Lipase: 15 U/L (ref 11–51)

## 2022-10-09 NOTE — ED Triage Notes (Signed)
N/v/d since this morning.
